# Patient Record
Sex: Male | Born: 1945
Health system: Southern US, Community
[De-identification: ages and names within clinical notes are randomized; demographics above are authoritative.]

## PROBLEM LIST (undated history)

## (undated) DIAGNOSIS — K589 Irritable bowel syndrome without diarrhea: Secondary | ICD-10-CM

## (undated) DIAGNOSIS — D649 Anemia, unspecified: Secondary | ICD-10-CM

## (undated) DIAGNOSIS — N529 Male erectile dysfunction, unspecified: Secondary | ICD-10-CM

## (undated) DIAGNOSIS — K8020 Calculus of gallbladder without cholecystitis without obstruction: Secondary | ICD-10-CM

## (undated) DIAGNOSIS — I00 Rheumatic fever without heart involvement: Secondary | ICD-10-CM

## (undated) DIAGNOSIS — E785 Hyperlipidemia, unspecified: Secondary | ICD-10-CM

## (undated) DIAGNOSIS — F419 Anxiety disorder, unspecified: Secondary | ICD-10-CM

## (undated) DIAGNOSIS — F32A Depression, unspecified: Secondary | ICD-10-CM

## (undated) DIAGNOSIS — I7300 Raynaud's syndrome without gangrene: Secondary | ICD-10-CM

## (undated) DIAGNOSIS — Z860100 Personal history of colon polyps, unspecified: Secondary | ICD-10-CM

## (undated) DIAGNOSIS — F329 Major depressive disorder, single episode, unspecified: Secondary | ICD-10-CM

## (undated) DIAGNOSIS — K5790 Diverticulosis of intestine, part unspecified, without perforation or abscess without bleeding: Secondary | ICD-10-CM

## (undated) DIAGNOSIS — K449 Diaphragmatic hernia without obstruction or gangrene: Secondary | ICD-10-CM

## (undated) DIAGNOSIS — N400 Enlarged prostate without lower urinary tract symptoms: Secondary | ICD-10-CM

## (undated) DIAGNOSIS — K222 Esophageal obstruction: Secondary | ICD-10-CM

## (undated) DIAGNOSIS — M797 Fibromyalgia: Secondary | ICD-10-CM

## (undated) DIAGNOSIS — K5090 Crohn's disease, unspecified, without complications: Secondary | ICD-10-CM

## (undated) DIAGNOSIS — Z8489 Family history of other specified conditions: Secondary | ICD-10-CM

## (undated) DIAGNOSIS — K219 Gastro-esophageal reflux disease without esophagitis: Secondary | ICD-10-CM

## (undated) DIAGNOSIS — R011 Cardiac murmur, unspecified: Secondary | ICD-10-CM

## (undated) HISTORY — DX: Calculus of gallbladder without cholecystitis without obstruction: K80.20

## (undated) HISTORY — PX: POLYPECTOMY: SHX149

## (undated) HISTORY — DX: Diverticulosis of intestine, part unspecified, without perforation or abscess without bleeding: K57.90

## (undated) HISTORY — DX: Raynaud's syndrome without gangrene: I73.00

## (undated) HISTORY — DX: Hyperlipidemia, unspecified: E78.5

## (undated) HISTORY — DX: Depression, unspecified: F32.A

## (undated) HISTORY — DX: Male erectile dysfunction, unspecified: N52.9

## (undated) HISTORY — DX: Esophageal obstruction: K22.2

## (undated) HISTORY — DX: Personal history of colon polyps, unspecified: Z86.0100

## (undated) HISTORY — DX: Diaphragmatic hernia without obstruction or gangrene: K44.9

## (undated) HISTORY — DX: Rheumatic fever without heart involvement: I00

## (undated) HISTORY — DX: Crohn's disease, unspecified, without complications: K50.90

## (undated) HISTORY — DX: Major depressive disorder, single episode, unspecified: F32.9

## (undated) HISTORY — DX: Irritable bowel syndrome, unspecified: K58.9

## (undated) HISTORY — DX: Anxiety disorder, unspecified: F41.9

## (undated) HISTORY — DX: Personal history of colonic polyps: Z86.010

## (undated) HISTORY — PX: TRANSURETHRAL RESECTION OF PROSTATE: SHX73

## (undated) HISTORY — DX: Benign prostatic hyperplasia without lower urinary tract symptoms: N40.0

## (undated) HISTORY — DX: Gastro-esophageal reflux disease without esophagitis: K21.9

## (undated) HISTORY — PX: COLONOSCOPY: SHX174

## (undated) HISTORY — PX: CHOLECYSTECTOMY: SHX55

---

## 1999-01-10 ENCOUNTER — Encounter (INDEPENDENT_AMBULATORY_CARE_PROVIDER_SITE_OTHER): Payer: Self-pay | Admitting: Specialist

## 1999-01-10 ENCOUNTER — Ambulatory Visit (HOSPITAL_COMMUNITY): Admission: RE | Admit: 1999-01-10 | Discharge: 1999-01-10 | Payer: Self-pay | Admitting: Gastroenterology

## 1999-05-03 ENCOUNTER — Encounter: Payer: Self-pay | Admitting: Gastroenterology

## 1999-05-03 ENCOUNTER — Ambulatory Visit (HOSPITAL_COMMUNITY): Admission: RE | Admit: 1999-05-03 | Discharge: 1999-05-03 | Payer: Self-pay | Admitting: Gastroenterology

## 2001-01-05 ENCOUNTER — Emergency Department (HOSPITAL_COMMUNITY): Admission: EM | Admit: 2001-01-05 | Discharge: 2001-01-05 | Payer: Self-pay

## 2001-05-27 ENCOUNTER — Encounter: Payer: Self-pay | Admitting: Urology

## 2001-05-27 ENCOUNTER — Encounter: Admission: RE | Admit: 2001-05-27 | Discharge: 2001-05-27 | Payer: Self-pay | Admitting: Urology

## 2002-01-18 ENCOUNTER — Encounter: Admission: RE | Admit: 2002-01-18 | Discharge: 2002-01-18 | Payer: Self-pay | Admitting: Specialist

## 2002-01-18 ENCOUNTER — Encounter: Payer: Self-pay | Admitting: Specialist

## 2003-03-01 ENCOUNTER — Encounter: Payer: Self-pay | Admitting: Emergency Medicine

## 2003-03-01 ENCOUNTER — Encounter: Payer: Self-pay | Admitting: General Surgery

## 2003-03-01 ENCOUNTER — Encounter (INDEPENDENT_AMBULATORY_CARE_PROVIDER_SITE_OTHER): Payer: Self-pay | Admitting: Specialist

## 2003-03-01 ENCOUNTER — Inpatient Hospital Stay (HOSPITAL_COMMUNITY): Admission: EM | Admit: 2003-03-01 | Discharge: 2003-03-03 | Payer: Self-pay | Admitting: Emergency Medicine

## 2004-04-29 ENCOUNTER — Ambulatory Visit: Payer: Self-pay | Admitting: Gastroenterology

## 2004-05-01 ENCOUNTER — Ambulatory Visit (HOSPITAL_COMMUNITY): Admission: RE | Admit: 2004-05-01 | Discharge: 2004-05-01 | Payer: Self-pay | Admitting: Gastroenterology

## 2004-05-01 ENCOUNTER — Ambulatory Visit: Payer: Self-pay | Admitting: Gastroenterology

## 2004-05-16 ENCOUNTER — Ambulatory Visit: Payer: Self-pay | Admitting: Gastroenterology

## 2004-09-16 ENCOUNTER — Encounter: Admission: RE | Admit: 2004-09-16 | Discharge: 2004-09-16 | Payer: Self-pay | Admitting: Rheumatology

## 2007-06-28 ENCOUNTER — Ambulatory Visit: Payer: Self-pay | Admitting: Internal Medicine

## 2007-07-22 ENCOUNTER — Ambulatory Visit: Payer: Self-pay | Admitting: Internal Medicine

## 2007-07-22 ENCOUNTER — Encounter: Payer: Self-pay | Admitting: Internal Medicine

## 2007-07-22 LAB — CONVERTED CEMR LAB
Basophils Relative: 0.9 % (ref 0.0–1.0)
CRP: 1.3 mg/dL — ABNORMAL HIGH (ref <0.6)
HCT: 45.3 % (ref 39.0–52.0)
Hemoglobin: 14.8 g/dL (ref 13.0–17.0)
Monocytes Absolute: 0.7 10*3/uL (ref 0.2–0.7)
Monocytes Relative: 9.5 % (ref 3.0–11.0)
RBC: 5.55 M/uL (ref 4.22–5.81)
RDW: 13.3 % (ref 11.5–14.6)

## 2007-09-08 ENCOUNTER — Ambulatory Visit: Payer: Self-pay | Admitting: Internal Medicine

## 2008-02-08 ENCOUNTER — Telehealth: Payer: Self-pay | Admitting: Internal Medicine

## 2009-01-24 ENCOUNTER — Ambulatory Visit: Payer: Self-pay | Admitting: Cardiology

## 2009-01-24 DIAGNOSIS — R072 Precordial pain: Secondary | ICD-10-CM | POA: Insufficient documentation

## 2009-01-24 DIAGNOSIS — E7800 Pure hypercholesterolemia, unspecified: Secondary | ICD-10-CM | POA: Insufficient documentation

## 2009-01-24 DIAGNOSIS — R5381 Other malaise: Secondary | ICD-10-CM

## 2009-01-24 DIAGNOSIS — R5383 Other fatigue: Secondary | ICD-10-CM | POA: Insufficient documentation

## 2009-01-25 ENCOUNTER — Encounter: Payer: Self-pay | Admitting: Cardiology

## 2009-02-09 ENCOUNTER — Ambulatory Visit: Payer: Self-pay | Admitting: Cardiology

## 2009-02-09 ENCOUNTER — Ambulatory Visit: Payer: Self-pay

## 2009-02-09 DIAGNOSIS — R9439 Abnormal result of other cardiovascular function study: Secondary | ICD-10-CM | POA: Insufficient documentation

## 2009-02-13 ENCOUNTER — Telehealth (INDEPENDENT_AMBULATORY_CARE_PROVIDER_SITE_OTHER): Payer: Self-pay | Admitting: *Deleted

## 2009-02-14 ENCOUNTER — Encounter (HOSPITAL_COMMUNITY): Admission: RE | Admit: 2009-02-14 | Discharge: 2009-04-13 | Payer: Self-pay | Admitting: Cardiology

## 2009-02-14 ENCOUNTER — Ambulatory Visit: Payer: Self-pay | Admitting: Cardiology

## 2009-02-14 ENCOUNTER — Ambulatory Visit: Payer: Self-pay

## 2009-02-16 ENCOUNTER — Encounter: Payer: Self-pay | Admitting: Cardiology

## 2009-02-19 ENCOUNTER — Ambulatory Visit: Payer: Self-pay | Admitting: Cardiology

## 2009-02-22 ENCOUNTER — Ambulatory Visit: Payer: Self-pay | Admitting: Cardiology

## 2009-02-22 ENCOUNTER — Inpatient Hospital Stay (HOSPITAL_BASED_OUTPATIENT_CLINIC_OR_DEPARTMENT_OTHER): Admission: RE | Admit: 2009-02-22 | Discharge: 2009-02-22 | Payer: Self-pay | Admitting: Cardiology

## 2009-02-22 LAB — CONVERTED CEMR LAB
Basophils Relative: 0 % (ref 0.0–3.0)
CO2: 29 meq/L (ref 19–32)
Chloride: 96 meq/L (ref 96–112)
Creatinine, Ser: 1 mg/dL (ref 0.4–1.5)
Eosinophils Absolute: 0.4 10*3/uL (ref 0.0–0.7)
Eosinophils Relative: 4.8 % (ref 0.0–5.0)
HCT: 45.7 % (ref 39.0–52.0)
Hemoglobin: 15.4 g/dL (ref 13.0–17.0)
Lymphs Abs: 2.5 10*3/uL (ref 0.7–4.0)
MCHC: 33.8 g/dL (ref 30.0–36.0)
MCV: 87.7 fL (ref 78.0–100.0)
Monocytes Absolute: 0.4 10*3/uL (ref 0.1–1.0)
Neutro Abs: 4 10*3/uL (ref 1.4–7.7)
Neutrophils Relative %: 55.3 % (ref 43.0–77.0)
Potassium: 4.7 meq/L (ref 3.5–5.1)
RBC: 5.2 M/uL (ref 4.22–5.81)

## 2009-12-19 ENCOUNTER — Telehealth: Payer: Self-pay | Admitting: Internal Medicine

## 2009-12-25 ENCOUNTER — Encounter (INDEPENDENT_AMBULATORY_CARE_PROVIDER_SITE_OTHER): Payer: Self-pay | Admitting: *Deleted

## 2010-06-01 ENCOUNTER — Encounter: Payer: Self-pay | Admitting: Internal Medicine

## 2010-06-11 NOTE — Progress Notes (Signed)
Summary: Med refill   Phone Note Outgoing Call   Call placed by: Milford Cage [place],  December 19, 2009 2:19 PM Call placed to: Patient Summary of Call: Called patient and left message for him to call to make a follow-up appt. with Dr. Marina Goodell before I given him refills of his Omeprazole.  It has been since 08/2007 since he saw Dr. Marina Goodell last.   Initial call taken by: Milford Cage NCMA,  December 19, 2009 2:20 PM  Follow-up for Phone Call        left message on machine to call back Chales Abrahams CMA Duncan Dull)  December 25, 2009 9:37 AM   letter mailed Follow-up by: Chales Abrahams CMA Duncan Dull),  December 25, 2009 4:28 PM

## 2010-06-11 NOTE — Letter (Signed)
Summary: Appointment Reminder   Gastroenterology  [address] [place], [place] [address]   Phone: [phone]  Fax: [phone]        December 25, 2009 MRN: [id number]    [facility] [address] [place], [place]  [address]    Dear Mr. MIZRAHI,   We have been unable to reach you by phone to schedule a follow up   appointment that was recommended for you by Dr. Marina Goodell. It is very   important that we reach you to schedule an appointment. We hope that you  allow [place] to participate in your health care needs. Please contact us at  [phone] at your earliest convenience to schedule your appointment.     Sincerely,    Chales Abrahams CMA (AAMA)  Appended Document: Appointment Reminder pt aware

## 2010-07-22 ENCOUNTER — Encounter: Payer: Self-pay | Admitting: Internal Medicine

## 2010-07-30 NOTE — Letter (Signed)
Summary: New Patient letter  [facility]astroenterology  [address] [place], [place] [address]   Phone: [phone]  Fax: [phone]       07/22/2010 MRN: [id number]  [facility] [address] [place], [place]  [address]  Dear Alexander Lambert,  Welcome to the Gastroenterology Division at [facility].    You are scheduled to see Dr.  Marina Goodell on 08-29-10 at 3:30pm on the 3rd floor at [facility], [address]  We ask that you try to arrive at our office 15 minutes prior to your appointment time to allow for check-in.  We would like you to complete the enclosed self-administered evaluation form prior to your visit and bring it with you on the day of your appointment.  We will review it with you.  Also, please bring a complete list of all your medications or, if you prefer, bring the medication bottles and we will list them.  Please bring your insurance card so that we may make a copy of it.  If your insurance requires a referral to see a specialist, please bring your referral form from your primary care physician.  Co-payments are due at the time of your visit and may be paid by cash, check or credit card.     Your office visit will consist of a consult with your physician (includes a physical exam), any laboratory testing he/she may order, scheduling of any necessary diagnostic testing (e.g. x-ray, ultrasound, CT-scan), and scheduling of a procedure (e.g. Endoscopy, Colonoscopy) if required.  Please allow enough time on your schedule to allow for any/all of these possibilities.    If you cannot keep your appointment, please call [phone] to cancel or reschedule prior to your appointment date.  This allows [place] the opportunity to schedule an appointment for another patient in need of care.  If you do not cancel or reschedule by 5 p.m. the business day prior to your appointment date, you will be charged a $50.00 late cancellation/no-show fee.    Thank you for choosing [facility] for your medical needs.  We appreciate the opportunity to care for you.  Please visit [place] at our website  to learn more about our practice.                     Sincerely,                                                             The Gastroenterology Division

## 2010-07-31 ENCOUNTER — Other Ambulatory Visit (HOSPITAL_COMMUNITY): Payer: Self-pay | Admitting: Orthopedic Surgery

## 2010-07-31 DIAGNOSIS — M549 Dorsalgia, unspecified: Secondary | ICD-10-CM

## 2010-08-07 ENCOUNTER — Encounter (HOSPITAL_COMMUNITY)
Admission: RE | Admit: 2010-08-07 | Discharge: 2010-08-07 | Disposition: A | Discharge status: Home / Self Care | Payer: Medicare Other | Source: Ambulatory Visit | Attending: Orthopedic Surgery | Admitting: Orthopedic Surgery

## 2010-08-07 ENCOUNTER — Ambulatory Visit (HOSPITAL_COMMUNITY)
Admission: RE | Admit: 2010-08-07 | Discharge: 2010-08-07 | Disposition: A | Discharge status: Home / Self Care | Payer: Medicare Other | Source: Ambulatory Visit | Attending: Orthopedic Surgery | Admitting: Orthopedic Surgery

## 2010-08-07 ENCOUNTER — Encounter (HOSPITAL_COMMUNITY): Payer: Self-pay

## 2010-08-07 DIAGNOSIS — M545 Low back pain, unspecified: Secondary | ICD-10-CM | POA: Insufficient documentation

## 2010-08-07 DIAGNOSIS — M4120 Other idiopathic scoliosis, site unspecified: Secondary | ICD-10-CM | POA: Insufficient documentation

## 2010-08-07 DIAGNOSIS — IMO0001 Reserved for inherently not codable concepts without codable children: Secondary | ICD-10-CM | POA: Insufficient documentation

## 2010-08-07 DIAGNOSIS — M549 Dorsalgia, unspecified: Secondary | ICD-10-CM

## 2010-08-07 DIAGNOSIS — M47814 Spondylosis without myelopathy or radiculopathy, thoracic region: Secondary | ICD-10-CM | POA: Insufficient documentation

## 2010-08-07 DIAGNOSIS — M25569 Pain in unspecified knee: Secondary | ICD-10-CM | POA: Insufficient documentation

## 2010-08-07 HISTORY — DX: Fibromyalgia: M79.7

## 2010-08-07 MED ORDER — TECHNETIUM TC 99M MEDRONATE IV KIT
23.4000 | PACK | Freq: Once | INTRAVENOUS | Status: AC | PRN
  Administered 2010-08-07: 23.4 via INTRAVENOUS

## 2010-08-29 ENCOUNTER — Encounter: Payer: Self-pay | Admitting: Internal Medicine

## 2010-08-29 ENCOUNTER — Ambulatory Visit (INDEPENDENT_AMBULATORY_CARE_PROVIDER_SITE_OTHER): Payer: Medicare Other | Admitting: Internal Medicine

## 2010-08-29 VITALS — BP 136/82 | HR 68 | Ht 73.0 in | Wt 203.0 lb

## 2010-08-29 DIAGNOSIS — D509 Iron deficiency anemia, unspecified: Secondary | ICD-10-CM

## 2010-08-29 DIAGNOSIS — R197 Diarrhea, unspecified: Secondary | ICD-10-CM

## 2010-08-29 DIAGNOSIS — K222 Esophageal obstruction: Secondary | ICD-10-CM

## 2010-08-29 DIAGNOSIS — K5010 Crohn's disease of large intestine without complications: Secondary | ICD-10-CM

## 2010-08-29 DIAGNOSIS — K219 Gastro-esophageal reflux disease without esophagitis: Secondary | ICD-10-CM

## 2010-08-29 DIAGNOSIS — R1310 Dysphagia, unspecified: Secondary | ICD-10-CM

## 2010-08-29 MED ORDER — PEG-KCL-NACL-NASULF-NA ASC-C 100 G PO SOLR: 1.0000 | Freq: Once | ORAL | Status: AC

## 2010-08-29 NOTE — Patient Instructions (Signed)
Colon/Endo with Dil. 09/05/10 2:00 pm arrive at 1:00 pm on 4th floor Moviprep prescription sent to your pharmacy for you to pick up Colonoscopy and Endoscopy brochures given for you to read.
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
## 2010-08-30 ENCOUNTER — Encounter: Payer: Self-pay | Admitting: Internal Medicine

## 2010-09-04 ENCOUNTER — Encounter: Payer: Self-pay | Admitting: Internal Medicine

## 2010-09-05 ENCOUNTER — Encounter: Payer: Self-pay | Admitting: Internal Medicine

## 2010-09-05 ENCOUNTER — Ambulatory Visit (AMBULATORY_SURGERY_CENTER): Payer: Medicare Other | Admitting: Internal Medicine

## 2010-09-05 VITALS — BP 142/44 | HR 83 | Temp 98.5°F | Resp 16 | Ht 72.0 in | Wt 200.0 lb

## 2010-09-05 DIAGNOSIS — K5730 Diverticulosis of large intestine without perforation or abscess without bleeding: Secondary | ICD-10-CM

## 2010-09-05 DIAGNOSIS — R1310 Dysphagia, unspecified: Secondary | ICD-10-CM

## 2010-09-05 DIAGNOSIS — R197 Diarrhea, unspecified: Secondary | ICD-10-CM

## 2010-09-05 DIAGNOSIS — K5090 Crohn's disease, unspecified, without complications: Secondary | ICD-10-CM

## 2010-09-05 DIAGNOSIS — K222 Esophageal obstruction: Secondary | ICD-10-CM

## 2010-09-05 DIAGNOSIS — Z86010 Personal history of colonic polyps: Secondary | ICD-10-CM

## 2010-09-05 DIAGNOSIS — K449 Diaphragmatic hernia without obstruction or gangrene: Secondary | ICD-10-CM

## 2010-09-05 DIAGNOSIS — D509 Iron deficiency anemia, unspecified: Secondary | ICD-10-CM

## 2010-09-05 MED ORDER — SODIUM CHLORIDE 0.9 % IV SOLN: 500.0000 mL | INTRAVENOUS | Status: DC

## 2010-09-05 NOTE — Progress Notes (Signed)
Pressure applied to abdomen to reach cecum.  

## 2010-09-05 NOTE — Patient Instructions (Signed)
Discharged instructions given with verbal understanding. Handouts on diverticulosis, hiatal hernia, and a dilatation diet. Resume previous medications.

## 2010-09-06 ENCOUNTER — Telehealth: Payer: Self-pay

## 2010-09-06 NOTE — Telephone Encounter (Signed)
No answer

## 2010-09-24 NOTE — Assessment & Plan Note (Signed)
[facility]                         GASTROENTEROLOGY OFFICE NOTE   NAME:Alexander Lambert, Alexander Lambert                          MRN:          [id number]  DATE:06/28/2007                            DOB:          12-01-1945    REASON FOR EVALUATION:  1. Recurrent dysphagia.  2. Surveillance colonoscopy.   HISTORY:  This is a 65 year old white male with a history of  hyperlipidemia, anxiety/depression, benign esophageal stricture  requiring esophageal dilation, and unspecified mild ileocolitis,  possibly Crohn's.  Patient was a longstanding patient of Dr. Victorino Dike.  He has not been seen in this office since December of 2005.  He has undergone colonoscopy on two occasions.  His initial colonoscopy  was performed in August of 2000 to evaluate loose stools.  At that time  he was noted to have mild inflammatory process of the right colon and  terminal ileum.  Biopsies revealed chronic active ileitis and chronic  active colitis consistent with Crohn's.  He subsequently underwent  colonoscopy in November of 2004.  Again he was noted to have scattered  inflammatory changes.  Biopsies of the small bowel at that time were  negative.  However, biopsies of the colon again revealed chronic active  colitis consistent with Crohn's.  Patient did have inflammatory bowel  disease markers, which were negative.  He had been on Asacol  periodically, but has been on no such medication for years.  Currently  he denies any lower GI complaints such as altered bowel habits, loose  stools or bleeding.  His last upper endoscopy was performed January of  2006,  Symptomatic distal stricture of the esophagus was dilated with a  52 [place] Maloney dilator.  This resulted in marked improvement of his  dysphagia until recent months.  He has had recurrent problems with solid  foods principally.  He rarely has reflux symptoms for which he takes  Tums about once per week.  He states he may have had  colon polyps.  I do  not see any evidence of such on his prior reports.   PAST MEDICAL HISTORY:  1. Hyperlipidemia.  2. Anxiety and depression.  3. Fibromyalgia.  4. Esophageal stricture.  5. Inflammatory bowel disease.  6. Skin cancer.  7. Status post cholecystectomy.   ALLERGIES:  No known drug allergies.   CURRENT MEDICATIONS:  1. Zetia 10 mg daily.  2. Zoloft 200 mg daily.  3. Aspirin 81 mg daily.  4. Vicodin p.r.n.   FAMILY HISTORY:  No family history of gastrointestinal malignancy.  Father with heart disease.   SOCIAL HISTORY:  Patient is married with three children and lives with  his wife.  He has a college degree.  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  Does not smoke.  Does occasionally use  tobacco and alcohol.   REVIEW OF SYSTEMS:  Per diagnostic evaluation form.   PHYSICAL EXAMINATION:  Well appearing male in no acute distress.  Blood  pressure is 106/70, heart rate is 70 and regular.  Weight is 206.4  pounds.  He is 6 feet  2 inches in height.  HEENT:  Sclerae anicteric.  Conjunctivae are pink.  Oral mucosa is  intact.  No adenopathy.  LUNGS:  Clear.  HEART:  Regular.  ABDOMEN:  Soft without tenderness, mass or hernia.  Good bowel sounds  heard.  EXTREMITIES:  Without edema.   IMPRESSION:  1. Probable Crohn's colitis.  Currently asymptomatic off medical      therapy.  2. Recurrent dysphagia due to known esophageal stricture.  3. Occasional reflux symptoms, possibly the cause for stricture.   RECOMMENDATIONS:  1. Colonoscopy to assess for activity and extent of probable      inflammatory bowel disease, as well provide neoplasia screening.      The nature of the procedure as well as the risks, benefits and      alternatives were reviewed.  He understood and agreed to proceed.  2. Upper endoscopy with esophageal dilation.  The nature of the      procedure as well as the risks, benefits and alternatives were      reviewed.  He  understood and agreed to proceed.  3. Ongoing general medical care with Dr. Waynard Edwards.     Wilhemina Bonito. Marina Goodell, MD  Electronically Signed    JNP/MedQ  DD: 06/28/2007  DT: 06/28/2007  Job #: [id number]   cc:   Loraine Leriche A. Perini, M.D.

## 2010-09-24 NOTE — Assessment & Plan Note (Signed)
[facility]                         GASTROENTEROLOGY OFFICE NOTE   NAME:Galindez, COULSON WEHNER                          MRN:          [id number]  DATE:09/08/2007                            DOB:          10/08/1945    HISTORY:  Mr. Stankovich presents today for followup regarding management of  dysphagia and Crohn's colitis.  Patient was evaluated on June 28, 2007 for recurrent dysphagia and surveillance colonoscopy.  See that  dictation for details.  He had been a previous patient of Dr. Doreatha Martin  [place].  He underwent both colonoscopy and upper endoscopy on July 22, 2007.  His colonoscopy revealed scattered aphthous ulcers throughout  the colon, consistent with Crohn's disease.  Biopsies were also  consistent with the same.  The ileum was normal.  He did have left-sided  diverticulosis.  Upper endoscopy revealed the distal esophageal  stricture measuring 14 mm.  Esophagitis was present.  The stricture was  dilated with a sequential balloon up to 18 mm.  He was placed on Nexium.  Since his procedures, the patient reports resolution of dysphagia.  No  heartburn or indigestion.  He continues on a proton pump inhibitor,  though he switched to Prilosec OTC 20 mg daily.  In terms of his lower  abdomen, no complaints of abdominal pain, bleeding, or diarrhea.  I have  reviewed his procedures and biopsy results in detail.   Current medications include Zetia, Zoloft, aspirin, Lyrica, and  Prilosec.   PHYSICAL EXAMINATION:  A well-appearing male in no acute distress.  Blood pressure 102/68, heart rate 72.  Weight is 204.4 pounds.  HEENT:  Sclerae are anicteric.  LUNGS:  Clear.  HEART:  Regular.  ABDOMEN:  Soft without tenderness, mass, or hernia.  Good bowel sounds  heard.   IMPRESSION:  1. Crohn's colitis:  No real symptoms.  Recent laboratories reveal      minimally elevated C-reactive protein at 1.3. The  hemoglobin was      normal at 14.8.  We discussed today  the pro's and con's of medical      therapy.  We have decided on initiating aminosalicylates in hopes      of reducing colonic inflammation / ulceration.  2. Gastroesophageal reflux disease complicated by peptic stricture:      Currently asymptomatic post dilation on Prilosec.   RECOMMENDATIONS:  1. Continued Prilosec 20 mg daily.  2. Followup as needed for recurrent dysphagia.  3. Initiate Lialda 2.4 mg daily.  Samples have been provided as well      as prescription with      refills.  4. Office followup in one year.  Sooner if clinically indicated.     Wilhemina Bonito. Marina Goodell, MD  Electronically Signed    JNP/MedQ  DD: 09/08/2007  DT: 09/08/2007  Job #: [id number]   cc:   Loraine Leriche A. Perini, M.D.

## 2010-09-27 NOTE — Op Note (Signed)
NAME:  Alexander Lambert, Alexander Lambert NO.:  [id number]   MEDICAL RECORD NO.:  [id number]                   PATIENT TYPE:  INP   LOCATION:  1824                                 FACILITY:  [facility]   PHYSICIAN:  Jimmye Norman III, M.D.               DATE OF BIRTH:  1946-05-11   DATE OF PROCEDURE:  03/01/2003  DATE OF DISCHARGE:                                 OPERATIVE REPORT   PREOPERATIVE DIAGNOSIS:  Cholelithiasis and biliary colic with obstructed  cystic duct on HIDA scan.   POSTOPERATIVE DIAGNOSIS:  Acute cholecystitis with cholelithiasis.   PROCEDURE:  Laparoscopic cholecystectomy.   SURGEON:  Jimmye Norman, M.D.   ASSESSMENT:  No assistant.   ANESTHESIA:  General endotracheal.   ESTIMATED BLOOD LOSS:  75 to 100 mL.   COMPLICATIONS:  None.   CONDITION:  Stable.   INDICATION FOR OPERATION:  The patient is a 65 year old male with right  upper quadrant pain and nausea, with an ultrasound demonstrating a  gallstone, a CT scan demonstrating a gallstone and a HIDA scan demonstrating  an obstructed cystic duct.   FINDINGS:  The patient had evidence of acute cholecystitis with omental  adhesions to the dome of the gallbladder.  There were also adhesions to the  infundibulum of the gallbladder.  The cystic duct was necrotic, as were the  arterial structures, which were ligated with Hemoclips.   OPERATION:  The patient was taken to the operating room and placed on the  table in a supine position.  After an adequate endotracheal anesthetic was  administered, he was prepped and draped in the usual sterile manner,  exposing the midline and the right upper quadrant.   A supraumbilical curvilinear incision was made using a #11 blade and taken  down to the midline fascia using blunt dissection.  It was through this  midline fascia that a Veress needle was passed into the peritoneal cavity  and confirmed to be in position with the saline test.  Once this was done,  carbon dioxide insufflation was instilled into the peritoneal cavity up to a  maximum intra-abdominal pressure of 15 mmHg.   We subsequently passed an 11/12-mm bladed cannula and catheter and trocar  into the peritoneal cavity, confirming their position with the laparoscope  and attached camera light source.  We then passed two 5-mm cannulae in the  right costal margin area and a subxiphoid 11/12-mm cannula into the  peritoneal cavity under direct vision after making the initial skin incision  using a #11 blade.  Once all cannulae were in place, the patient was placed  in a steep reversed Trendelenburg, the left side was tilted down and the  dissection begun.   The gallbladder was noted, after taking off the omental adhesions with blunt  dissection, to be very tense.  We used a Nezhat aspirating needle to  aspirate out the clear bowel  from the distended gallbladder.  We then placed  a ratcheted grasper onto the dome of the gallbladder, retracted it towards  the anterior abdominal Harari and the right upper quadrant.   This exposed the structures down towards the infundibulum which were adhesed  now closely to the infundibulum.  With proper positioning, we were able to  identify the infundibulum and dissect away the peritoneum overlying that and  identify a necrotic cystic duct which was hanging on by a small thread.  We  were able to place proximal clips along the common duct side of the cystic  duct and then subsequently transect it.  There were smaller vessels which  appeared to go to the gallbladder in the triangle of Calot, which were  ligated with Endoclips and subsequently transected.  We then dissected the  gallbladder out of its bed using electrocautery on an endohook.  There was  no entrance into the gallbladder.  There was some bleeding down near the  hilum, however, this was controlled with Surgicel and at the end of the  case, was no longer bleeding.   We brought out the  gallbladder out of the supraumbilical site using an  EndoCatch bag after it had been completely detached from the gallbladder  bed.  There was significant edema in the bed, however, the dissection went  well with minimal bleeding.   Once we brought it out through the supraumbilical site, we did place a piece  of Surgicel down towards the hilum where there was no bleeding.  We did,  because of necrosis of the cystic duct, place a 19-mm Blake drain down into  Morrison's pouch and brought it out through the lateral-most trocar site.  We secured it in place with 3-0 nylon.  We irrigated with 3 L of warm saline  solution and once this was done and there was no further bleeding noted, we  removed all cannula and closed all sites.   The supraumbilical site was closed using a figure-of-eight stitch of 0  Vicryl passed on a UR-6 needle.  We injected the skin in all sites using  0.25% Marcaine with epinephrine.  All skin was closed using a running  subcuticular stitch of 4-0 Vicryl, except for the site of the drain.  Once  this was done, sterile dressings were applied including Steri-Strips,  Betadine ointment and OpSite.                                               Kathrin Ruddy, M.D.    JW/MEDQ  D:  03/01/2003  T:  03/02/2003  Job:  [id number]   cc:   Molpus, Rumeal L. M.D.   Doug Sou, M.D.  [address]  [place] [address]  Fax: [phone]
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
## 2011-04-01 ENCOUNTER — Encounter (HOSPITAL_COMMUNITY): Payer: Medicare Other

## 2011-04-09 ENCOUNTER — Other Ambulatory Visit (HOSPITAL_COMMUNITY): Payer: Self-pay | Admitting: *Deleted

## 2011-04-10 ENCOUNTER — Other Ambulatory Visit (HOSPITAL_COMMUNITY): Payer: Self-pay | Admitting: *Deleted

## 2011-04-11 ENCOUNTER — Encounter (HOSPITAL_COMMUNITY): Payer: Self-pay

## 2011-04-11 ENCOUNTER — Encounter (HOSPITAL_COMMUNITY)
Admission: RE | Admit: 2011-04-11 | Discharge: 2011-04-11 | Disposition: A | Discharge status: Home / Self Care | Payer: Medicare Other | Source: Ambulatory Visit | Attending: Internal Medicine | Admitting: Internal Medicine

## 2011-04-11 ENCOUNTER — Other Ambulatory Visit (HOSPITAL_COMMUNITY): Payer: Self-pay | Admitting: *Deleted

## 2011-04-11 DIAGNOSIS — D649 Anemia, unspecified: Secondary | ICD-10-CM | POA: Insufficient documentation

## 2011-04-11 MED ORDER — FERUMOXYTOL INJECTION 510 MG/17 ML
510.0000 mg | Freq: Once | INTRAVENOUS | Status: AC
  Administered 2011-04-11: 510 mg via INTRAVENOUS
  Filled 2011-04-11: qty 17

## 2011-04-11 MED ORDER — SODIUM CHLORIDE 0.9 % IV SOLN
INTRAVENOUS | Status: DC
  Administered 2011-04-11: 250 mL via INTRAVENOUS

## 2011-04-14 ENCOUNTER — Ambulatory Visit (HOSPITAL_COMMUNITY)
Admission: RE | Admit: 2011-04-14 | Discharge: 2011-04-14 | Disposition: A | Discharge status: Home / Self Care | Payer: Medicare Other | Source: Ambulatory Visit | Attending: Internal Medicine | Admitting: Internal Medicine

## 2011-04-14 ENCOUNTER — Encounter (HOSPITAL_COMMUNITY): Payer: Self-pay

## 2011-04-14 DIAGNOSIS — D649 Anemia, unspecified: Secondary | ICD-10-CM | POA: Insufficient documentation

## 2011-04-14 MED ORDER — FERUMOXYTOL INJECTION 510 MG/17 ML
510.0000 mg | Freq: Once | INTRAVENOUS | Status: AC
  Administered 2011-04-14: 510 mg via INTRAVENOUS
  Filled 2011-04-14: qty 17

## 2011-04-14 MED ORDER — SODIUM CHLORIDE 0.9 % IV SOLN
INTRAVENOUS | Status: DC
  Administered 2011-04-14: 250 mL via INTRAVENOUS

## 2011-09-16 ENCOUNTER — Ambulatory Visit
Admission: RE | Admit: 2011-09-16 | Discharge: 2011-09-16 | Disposition: A | Discharge status: Home / Self Care | Payer: Medicare Other | Source: Ambulatory Visit | Attending: Internal Medicine | Admitting: Internal Medicine

## 2011-09-16 ENCOUNTER — Other Ambulatory Visit: Payer: Self-pay | Admitting: Internal Medicine

## 2011-09-16 DIAGNOSIS — J984 Other disorders of lung: Secondary | ICD-10-CM

## 2012-09-07 ENCOUNTER — Encounter: Payer: Self-pay | Admitting: Internal Medicine

## 2012-09-07 ENCOUNTER — Ambulatory Visit (INDEPENDENT_AMBULATORY_CARE_PROVIDER_SITE_OTHER): Payer: Medicare Other | Admitting: Internal Medicine

## 2012-09-07 VITALS — BP 106/72 | HR 89 | Ht 72.0 in | Wt 215.0 lb

## 2012-09-07 DIAGNOSIS — K222 Esophageal obstruction: Secondary | ICD-10-CM

## 2012-09-07 DIAGNOSIS — K219 Gastro-esophageal reflux disease without esophagitis: Secondary | ICD-10-CM

## 2012-09-07 DIAGNOSIS — K5010 Crohn's disease of large intestine without complications: Secondary | ICD-10-CM

## 2012-09-07 DIAGNOSIS — R1310 Dysphagia, unspecified: Secondary | ICD-10-CM

## 2012-09-07 DIAGNOSIS — D509 Iron deficiency anemia, unspecified: Secondary | ICD-10-CM
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
## 2012-09-07 NOTE — Patient Instructions (Addendum)
You have been scheduled for an endoscopy with dilation with propofol. Please follow written instructions given to you at your visit today. If you use inhalers (even only as needed), please bring them with you on the day of your procedure.  Your physician has requested that you go to www.[email or web address].com and enter the access code given to you at your visit today. This web site gives a general overview about your procedure. However, you should still follow specific instructions given to you by our office regarding your preparation for the procedure.  

## 2012-10-05 ENCOUNTER — Ambulatory Visit (AMBULATORY_SURGERY_CENTER): Payer: Medicare Other | Admitting: Internal Medicine

## 2012-10-05 ENCOUNTER — Encounter: Payer: Self-pay | Admitting: Internal Medicine

## 2012-10-05 VITALS — BP 139/84 | HR 63 | Temp 98.1°F | Resp 31 | Ht 72.0 in | Wt 215.0 lb

## 2012-10-05 DIAGNOSIS — K222 Esophageal obstruction: Secondary | ICD-10-CM

## 2012-10-05 DIAGNOSIS — R1310 Dysphagia, unspecified: Secondary | ICD-10-CM

## 2012-10-05 DIAGNOSIS — K219 Gastro-esophageal reflux disease without esophagitis: Secondary | ICD-10-CM

## 2012-10-05 MED ORDER — SODIUM CHLORIDE 0.9 % IV SOLN: 500.0000 mL | INTRAVENOUS | Status: DC

## 2012-10-05 NOTE — Op Note (Signed)
[facility] [address] [place], [address]   ENDOSCOPY PROCEDURE REPORT  PATIENT: Alexander, Lambert  MR#: [id number] BIRTHDATE: Nov 26, 1945 , 67  yrs. old GENDER: Male ENDOSCOPIST: Roxy Cedar, MD REFERRED BY:  Rodrigo Ran, M.D. PROCEDURE DATE:  10/05/2012 PROCEDURE:  Balloon dilation of esophagus  - 18mm ASA CLASS:     Class II INDICATIONS:  Therapeutic procedure.   Dysphagia. MEDICATIONS: MAC sedation, administered by CRNA and propofol (Diprivan) 300mg  IV TOPICAL ANESTHETIC: none  DESCRIPTION OF PROCEDURE: After the risks benefits and alternatives of the procedure were thoroughly explained, informed consent was obtained.  The LB [id number] endoscope was introduced through the mouth and advanced to the second portion of the duodenum. Without limitations.  The instrument was slowly withdrawn as the mucosa was fully examined.      EXAM: 15 mm ring-like stricture at GEJ (35 cm from teeth).  6 cm hiatal hernia.  Otherwise normal esophagus, stomach and duodenum. Retroflexed views revealed a hiatal hernia.  THERAPY: 18 MM TTS BALLOON USED TO DILATE STRICTURE. MODERATE RESISTANCE AND HEME PRESENT (LOCAL RING DISRUPTION). TOLERATED WELLThe scope was then withdrawn from the patient and the procedure completed.  COMPLICATIONS: There were no complications. ENDOSCOPIC IMPRESSION: 1. 15 mm ring-like stricture at GEJ S/P DILATION 2. GERD  RECOMMENDATIONS: 1.  Clear liquids until 2 pm, then soft foods rest of day.  Resume prior diet tomorrow. 2.  Continue PPI therapy  REPEAT EXAM:  eSigned:  Roxy Cedar, MD 10/05/2012 12:07 PM   FA:OZHY Perini, MD and The Patient

## 2012-10-05 NOTE — Patient Instructions (Signed)
YOU HAD AN ENDOSCOPIC PROCEDURE TODAY AT THE [facility]: Refer to the procedure report that was given to you for any specific questions about what was found during the examination.  If the procedure report does not answer your questions, please call your gastroenterologist to clarify.  If you requested that your care partner not be given the details of your procedure findings, then the procedure report has been included in a sealed envelope for you to review at your convenience later.  YOU SHOULD EXPECT: Some feelings of bloating in the abdomen. Passage of more gas than usual.  Walking can help get rid of the air that was put into your GI tract during the procedure and reduce the bloating. If you had a lower endoscopy (such as a colonoscopy or flexible sigmoidoscopy) you may notice spotting of blood in your stool or on the toilet paper. If you underwent a bowel prep for your procedure, then you may not have a normal bowel movement for a few days.  DIET: Your first meal following the procedure should be a light meal and then it is ok to progress to your normal diet.  A half-sandwich or bowl of soup is an example of a good first meal.  Heavy or fried foods are harder to digest and may make you feel nauseous or bloated.  Likewise meals heavy in dairy and vegetables can cause extra gas to form and this can also increase the bloating.  Drink plenty of fluids but you should avoid alcoholic beverages for 24 hours.  ACTIVITY: Your care partner should take you home directly after the procedure.  You should plan to take it easy, moving slowly for the rest of the day.  You can resume normal activity the day after the procedure however you should NOT DRIVE or use heavy machinery for 24 hours (because of the sedation medicines used during the test).    SYMPTOMS TO REPORT IMMEDIATELY: A gastroenterologist can be reached at any hour.  During normal business hours, 8:30 AM to 5:00 PM Monday through Friday,  call [phone].  After hours and on weekends, please call the GI answering service at [phone] who will take a message and have the physician on call contact you.    Following upper endoscopy (EGD)  Vomiting of blood or coffee ground material  New chest pain or pain under the shoulder blades  Painful or persistently difficult swallowing  New shortness of breath  Fever of 100F or higher  Black, tarry-looking stools  FOLLOW UP: If any biopsies were taken you will be contacted by phone or by letter within the next 1-3 weeks.  Call your gastroenterologist if you have not heard about the biopsies in 3 weeks.  Our staff will call the home number listed on your records the next business day following your procedure to check on you and address any questions or concerns that you may have at that time regarding the information given to you following your procedure. This is a courtesy call and so if there is no answer at the home number and we have not heard from you through the emergency physician on call, we will assume that you have returned to your regular daily activities without incident.  SIGNATURES/CONFIDENTIALITY: You and/or your care partner have signed paperwork which will be entered into your electronic medical record.  These signatures attest to the fact that that the information above on your After Visit Summary has been reviewed and is understood.  Full  responsibility of the confidentiality of this discharge information lies with you and/or your care-partner.  Clear liquids until 2:00pm, then soft foods for the rest of day. Resume prior diet tomorrow. Continue PPI therapy.

## 2012-10-05 NOTE — Progress Notes (Signed)
NO EGG OR SOY ALLERGY, EWM

## 2012-10-05 NOTE — Progress Notes (Signed)
Patient did not experience any of the following events: a burn prior to discharge; a fall within the facility; wrong site/side/patient/procedure/implant event; or a hospital transfer or hospital admission upon discharge from the facility. (G8907) Patient did not have preoperative order for IV antibiotic SSI prophylaxis. (G8918)  

## 2012-10-05 NOTE — Progress Notes (Signed)
Called to room to assist during endoscopic procedure.  Patient ID and intended procedure confirmed with present staff. Received instructions for my participation in the procedure from the performing physician.  

## 2012-10-06 ENCOUNTER — Telehealth: Payer: Self-pay

## 2012-10-06 NOTE — Telephone Encounter (Signed)
  Follow up Call-  Call back number 10/05/2012 09/05/2010  Post procedure Call Back phone  # [phone]  Permission to leave phone message Yes -     Patient questions:  Do you have a fever, pain , or abdominal swelling? no Pain Score  0 *  Have you tolerated food without any problems? yes  Have you been able to return to your normal activities? yes  Do you have any questions about your discharge instructions: Diet   no Medications  no Follow up visit  no  Do you have questions or concerns about your Care? no  Actions: * If pain score is 4 or above: No action needed, pain <4.

## 2012-10-25 ENCOUNTER — Encounter: Payer: Self-pay | Admitting: Internal Medicine

## 2012-10-25 ENCOUNTER — Ambulatory Visit (INDEPENDENT_AMBULATORY_CARE_PROVIDER_SITE_OTHER): Payer: Medicare Other | Admitting: Internal Medicine

## 2012-10-25 VITALS — BP 144/76 | HR 68 | Ht 72.0 in | Wt 205.0 lb

## 2012-10-25 DIAGNOSIS — K222 Esophageal obstruction: Secondary | ICD-10-CM

## 2012-10-25 DIAGNOSIS — K5010 Crohn's disease of large intestine without complications: Secondary | ICD-10-CM

## 2012-10-25 DIAGNOSIS — R1310 Dysphagia, unspecified: Secondary | ICD-10-CM

## 2012-10-25 DIAGNOSIS — K219 Gastro-esophageal reflux disease without esophagitis: Secondary | ICD-10-CM

## 2012-10-25 NOTE — Patient Instructions (Signed)
Please follow up in one year or sooner if symptoms reoccur.
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
## 2012-10-28 ENCOUNTER — Telehealth: Payer: Self-pay | Admitting: Internal Medicine

## 2012-10-28 NOTE — Telephone Encounter (Signed)
Pt scheduled for previsit and egd with dil with Dr. Marina Goodell. See OV Note.

## 2012-11-01 ENCOUNTER — Ambulatory Visit (AMBULATORY_SURGERY_CENTER): Payer: Medicare Other | Admitting: *Deleted

## 2012-11-01 ENCOUNTER — Encounter: Payer: Self-pay | Admitting: Internal Medicine

## 2012-11-01 VITALS — Ht 72.0 in | Wt 207.0 lb

## 2012-11-01 DIAGNOSIS — K222 Esophageal obstruction: Secondary | ICD-10-CM

## 2012-11-01 DIAGNOSIS — R1310 Dysphagia, unspecified: Secondary | ICD-10-CM

## 2012-11-09 ENCOUNTER — Ambulatory Visit (AMBULATORY_SURGERY_CENTER): Payer: Medicare Other | Admitting: Internal Medicine

## 2012-11-09 ENCOUNTER — Encounter: Payer: Self-pay | Admitting: Internal Medicine

## 2012-11-09 VITALS — BP 112/68 | HR 68 | Temp 97.4°F | Resp 13 | Ht 72.0 in | Wt 207.0 lb

## 2012-11-09 DIAGNOSIS — K222 Esophageal obstruction: Secondary | ICD-10-CM

## 2012-11-09 DIAGNOSIS — R1310 Dysphagia, unspecified: Secondary | ICD-10-CM

## 2012-11-09 DIAGNOSIS — K219 Gastro-esophageal reflux disease without esophagitis: Secondary | ICD-10-CM

## 2012-11-09 MED ORDER — SODIUM CHLORIDE 0.9 % IV SOLN: 500.0000 mL | INTRAVENOUS | Status: DC

## 2012-11-09 NOTE — Progress Notes (Signed)
1430 COUGHING AND  O2 DESATURATION NOTED SCOPED REMOVED AND SUCTIONED. O2 INCREASED TO 10L.AND REPOSITIONED JAW . SATURATION RETURNED TO NORMAL .SUCTIONED. PROCEDURE COMPLETED WITHOUT DIFF. TO PACU

## 2012-11-09 NOTE — Progress Notes (Signed)
Patient to restroom prior to discharge.

## 2012-11-09 NOTE — Progress Notes (Signed)
Patient did not experience any of the following events: a burn prior to discharge; a fall within the facility; wrong site/side/patient/procedure/implant event; or a hospital transfer or hospital admission upon discharge from the facility. (G8907) Patient did not have preoperative order for IV antibiotic SSI prophylaxis. (G8918)  

## 2012-11-09 NOTE — Progress Notes (Signed)
Patient denies any discomfort. No subq air palpated from chin to chest.

## 2012-11-09 NOTE — Patient Instructions (Signed)
YOU HAD AN ENDOSCOPIC PROCEDURE TODAY AT THE [facility]: Refer to the procedure report that was given to you for any specific questions about what was found during the examination.  If the procedure report does not answer your questions, please call your gastroenterologist to clarify.  If you requested that your care partner not be given the details of your procedure findings, then the procedure report has been included in a sealed envelope for you to review at your convenience later.  YOU SHOULD EXPECT: Some feelings of bloating in the abdomen. Passage of more gas than usual.  Walking can help get rid of the air that was put into your GI tract during the procedure and reduce the bloating. If you had a lower endoscopy (such as a colonoscopy or flexible sigmoidoscopy) you may notice spotting of blood in your stool or on the toilet paper. If you underwent a bowel prep for your procedure, then you may not have a normal bowel movement for a few days.  DIET:  Nothing to eat or drink until 3:45. Only clear liquids 3:45 through 5:00 today. After 5:00 pm soft foods for the remainder of the day. Resume your diet in AM.    ACTIVITY: Your care partner should take you home directly after the procedure.  You should plan to take it easy, moving slowly for the rest of the day.  You can resume normal activity the day after the procedure however you should NOT DRIVE or use heavy machinery for 24 hours (because of the sedation medicines used during the test).    SYMPTOMS TO REPORT IMMEDIATELY: A gastroenterologist can be reached at any hour.  During normal business hours, 8:30 AM to 5:00 PM Monday through Friday, call [phone].  After hours and on weekends, please call the GI answering service at [phone] who will take a message and have the physician on call contact you.  Following upper endoscopy (EGD)  Vomiting of blood or coffee ground material  New chest pain or pain under the  shoulder blades  Painful or persistently difficult swallowing  New shortness of breath  Fever of 100F or higher  Black, tarry-looking stools  FOLLOW UP: If any biopsies were taken you will be contacted by phone or by letter within the next 1-3 weeks.  Call your gastroenterologist if you have not heard about the biopsies in 3 weeks.  Our staff will call the home number listed on your records the next business day following your procedure to check on you and address any questions or concerns that you may have at that time regarding the information given to you following your procedure. This is a courtesy call and so if there is no answer at the home number and we have not heard from you through the emergency physician on call, we will assume that you have returned to your regular daily activities without incident.  SIGNATURES/CONFIDENTIALITY: You and/or your care partner have signed paperwork which will be entered into your electronic medical record.  These signatures attest to the fact that that the information above on your After Visit Summary has been reviewed and is understood.  Full responsibility of the confidentiality of this discharge information lies with you and/or your care-partner.

## 2012-11-09 NOTE — Op Note (Signed)
[facility] [address] [place], [address]   ENDOSCOPY PROCEDURE REPORT  PATIENT: Jamian, Andujo  MR#: [id number] BIRTHDATE: 04-30-1946 , 67  yrs. old GENDER: Male ENDOSCOPIST: Roxy Cedar, MD REFERRED BY:  .  Self / Office PROCEDURE DATE:  11/09/2012 PROCEDURE:  Balloon dilation of esophagus  - 18,19,20 mm ASA CLASS:     Class II INDICATIONS:  Dysphagia.   Therapeutic procedure.  Last dilated 10-05-12 to 18mm, w/o relief MEDICATIONS: MAC sedation, administered by CRNA and propofol (Diprivan) 300mg  IV TOPICAL ANESTHETIC: none  DESCRIPTION OF PROCEDURE: After the risks benefits and alternatives of the procedure were thoroughly explained, informed consent was obtained.  The LB [id number] endoscope was introduced through the mouth and advanced to the second portion of the duodenum. Without limitations.  The instrument was slowly withdrawn as the mucosa was fully examined.    EXAM:Tortuous esophagus.  15mm stricture at 33cm.  8cm hiatal hernia with multiple Cameron erosions.  Normal stomach otherwise.  Normal duodenum.  THERAPY: TTS Balloon used to dilate distal stricture.  18,19,72mm sequentially.  ring disruption with heme on the largest dilation. tolerated well.  Retroflexed views revealed a hiatal hernia. The scope was then withdrawn from the patient and the procedure completed.  COMPLICATIONS: There were no complications. ENDOSCOPIC IMPRESSION: 1. Esophageal stricture s/p dilation to 20mm 2. Large hiatal hernia with Sheria Lang erosions  RECOMMENDATIONS: 1.  Clear liquids until 5pm, then soft foods rest of day.  Resume prior diet tomorrow. 2.  Continue PPI  REPEAT EXAM:  eSigned:  Roxy Cedar, MD 11/09/2012 2:46 PM   UX:LKGM Perini, MD and The Patient

## 2012-11-09 NOTE — Progress Notes (Signed)
Called to room to assist during endoscopic procedure.  Patient ID and intended procedure confirmed with present staff. Received instructions for my participation in the procedure from the performing physician.  

## 2012-11-10 ENCOUNTER — Telehealth: Payer: Self-pay

## 2012-11-10 NOTE — Telephone Encounter (Signed)
  Follow up Call-  Call back number 11/09/2012 10/05/2012 09/05/2010  Post procedure Call Back phone  # [phone]  Permission to leave phone message Yes Yes -     Patient questions:  Do you have a fever, pain , or abdominal swelling? no Pain Score  0 *  Have you tolerated food without any problems? yes  Have you been able to return to your normal activities? yes  Do you have any questions about your discharge instructions: Diet   no Medications  no Follow up visit  no  Do you have questions or concerns about your Care? no  Actions: * If pain score is 4 or above: No action needed, pain <4.   Per the pt everything went perfectly.  No problems or concerns. Maw

## 2012-11-16 ENCOUNTER — Encounter: Payer: Self-pay | Admitting: Internal Medicine

## 2013-04-15 ENCOUNTER — Telehealth: Payer: Self-pay | Admitting: Internal Medicine

## 2013-04-15 NOTE — Telephone Encounter (Signed)
Pt states that he was told by his PCP that he is anemic. States he was told to call for an appt. Pt is getting iron replacement treatment. Pt scheduled to see Dr. Marina Goodell 05/13/13@3 :15pm. Pt aware of appt.

## 2013-04-21 ENCOUNTER — Other Ambulatory Visit (HOSPITAL_COMMUNITY): Payer: Self-pay | Admitting: *Deleted

## 2013-04-22 ENCOUNTER — Ambulatory Visit (HOSPITAL_COMMUNITY)
Admission: RE | Admit: 2013-04-22 | Discharge: 2013-04-22 | Disposition: A | Discharge status: Home / Self Care | Payer: Medicare Other | Source: Ambulatory Visit | Attending: Internal Medicine | Admitting: Internal Medicine

## 2013-04-22 DIAGNOSIS — D649 Anemia, unspecified: Secondary | ICD-10-CM | POA: Insufficient documentation

## 2013-04-22 MED ORDER — SODIUM CHLORIDE 0.9 % IV SOLN
1020.0000 mg | Freq: Once | INTRAVENOUS | Status: AC
  Administered 2013-04-22: 1020 mg via INTRAVENOUS
  Filled 2013-04-22: qty 34

## 2013-05-13 ENCOUNTER — Ambulatory Visit: Payer: Medicare Other | Admitting: Internal Medicine

## 2013-05-27 ENCOUNTER — Ambulatory Visit (INDEPENDENT_AMBULATORY_CARE_PROVIDER_SITE_OTHER): Payer: Medicare Other | Admitting: Internal Medicine

## 2013-05-27 ENCOUNTER — Encounter: Payer: Self-pay | Admitting: Internal Medicine

## 2013-05-27 ENCOUNTER — Other Ambulatory Visit (INDEPENDENT_AMBULATORY_CARE_PROVIDER_SITE_OTHER): Payer: Medicare HMO

## 2013-05-27 VITALS — BP 100/52 | HR 96 | Ht 70.0 in | Wt 209.1 lb

## 2013-05-27 DIAGNOSIS — D509 Iron deficiency anemia, unspecified: Secondary | ICD-10-CM

## 2013-05-27 DIAGNOSIS — K5010 Crohn's disease of large intestine without complications: Secondary | ICD-10-CM

## 2013-05-27 DIAGNOSIS — K222 Esophageal obstruction: Secondary | ICD-10-CM

## 2013-05-27 DIAGNOSIS — K219 Gastro-esophageal reflux disease without esophagitis: Secondary | ICD-10-CM

## 2013-05-27 LAB — FERRITIN: Ferritin: 62.1 ng/mL (ref 22.0–322.0)

## 2013-05-27 LAB — IBC PANEL
Iron: 30 ug/dL — ABNORMAL LOW (ref 42–165)
Saturation Ratios: 8.4 % — ABNORMAL LOW (ref 20.0–50.0)
Transferrin: 256.2 mg/dL (ref 212.0–360.0)

## 2013-05-27 LAB — CBC WITH DIFFERENTIAL/PLATELET
BASOS PCT: 0.9 % (ref 0.0–3.0)
Basophils Absolute: 0.1 10*3/uL (ref 0.0–0.1)
EOS ABS: 0.3 10*3/uL (ref 0.0–0.7)
Eosinophils Relative: 4.1 % (ref 0.0–5.0)
HEMATOCRIT: 42.1 % (ref 39.0–52.0)
HEMOGLOBIN: 13.8 g/dL (ref 13.0–17.0)
LYMPHS ABS: 1.9 10*3/uL (ref 0.7–4.0)
LYMPHS PCT: 27.1 % (ref 12.0–46.0)
MCHC: 32.7 g/dL (ref 30.0–36.0)
MCV: 83.3 fl (ref 78.0–100.0)
Monocytes Absolute: 0.7 10*3/uL (ref 0.1–1.0)
Monocytes Relative: 9.5 % (ref 3.0–12.0)
NEUTROS ABS: 4.1 10*3/uL (ref 1.4–7.7)
Neutrophils Relative %: 58.4 % (ref 43.0–77.0)
Platelets: 278 10*3/uL (ref 150.0–400.0)
RBC: 5.05 Mil/uL (ref 4.22–5.81)
RDW: 17.5 % — ABNORMAL HIGH (ref 11.5–14.6)
WBC: 7.1 10*3/uL (ref 4.5–10.5)

## 2013-05-27 LAB — IRON: Iron: 30 ug/dL — ABNORMAL LOW (ref 42–165)

## 2013-05-27 MED ORDER — FERROUS SULFATE 325 (65 FE) MG PO TABS: 325.0000 mg | ORAL_TABLET | Freq: Two times a day (BID) | ORAL | Status: DC

## 2013-05-27 NOTE — Patient Instructions (Addendum)
You have been given a separate informational sheet regarding your tobacco use, the importance of quitting and local resources to help you quit.  Your physician has requested that you go to the basement for the following lab work before leaving today:  Iron studies, CBC  We have sent the following medications to your pharmacy for you to pick up at your convenience:  Iron sulfate
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
## 2013-06-07 ENCOUNTER — Telehealth: Payer: Self-pay | Admitting: Internal Medicine

## 2013-06-07 NOTE — Telephone Encounter (Signed)
Pt called and wanted to know what his Hgb was on his last labs. Discussed lab values with pt and questions were answered.

## 2014-06-29 ENCOUNTER — Other Ambulatory Visit: Payer: Self-pay | Admitting: Internal Medicine

## 2014-06-29 DIAGNOSIS — R51 Headache: Secondary | ICD-10-CM

## 2014-06-29 DIAGNOSIS — R519 Headache, unspecified: Secondary | ICD-10-CM

## 2014-06-29 DIAGNOSIS — R6884 Jaw pain: Secondary | ICD-10-CM

## 2014-07-14 ENCOUNTER — Ambulatory Visit
Admission: RE | Admit: 2014-07-14 | Discharge: 2014-07-14 | Disposition: A | Discharge status: Home / Self Care | Payer: Medicare HMO | Source: Ambulatory Visit | Attending: Internal Medicine | Admitting: Internal Medicine

## 2014-07-14 DIAGNOSIS — R51 Headache: Secondary | ICD-10-CM

## 2014-07-14 DIAGNOSIS — R6884 Jaw pain: Secondary | ICD-10-CM

## 2014-07-14 DIAGNOSIS — R519 Headache, unspecified: Secondary | ICD-10-CM

## 2014-07-14 MED ORDER — GADOBENATE DIMEGLUMINE 529 MG/ML IV SOLN
20.0000 mL | Freq: Once | INTRAVENOUS | Status: AC | PRN
  Administered 2014-07-14: 20 mL via INTRAVENOUS

## 2014-12-15 ENCOUNTER — Encounter (HOSPITAL_COMMUNITY): Payer: Self-pay | Admitting: Emergency Medicine

## 2014-12-15 ENCOUNTER — Emergency Department (HOSPITAL_COMMUNITY)
Admission: EM | Admit: 2014-12-15 | Discharge: 2014-12-16 | Disposition: A | Discharge status: Home / Self Care | Payer: Medicare HMO | Attending: Emergency Medicine | Admitting: Emergency Medicine

## 2014-12-15 DIAGNOSIS — I10 Essential (primary) hypertension: Secondary | ICD-10-CM | POA: Diagnosis not present

## 2014-12-15 DIAGNOSIS — G500 Trigeminal neuralgia: Secondary | ICD-10-CM

## 2014-12-15 DIAGNOSIS — E559 Vitamin D deficiency, unspecified: Secondary | ICD-10-CM | POA: Insufficient documentation

## 2014-12-15 DIAGNOSIS — F329 Major depressive disorder, single episode, unspecified: Secondary | ICD-10-CM | POA: Diagnosis not present

## 2014-12-15 DIAGNOSIS — Z7982 Long term (current) use of aspirin: Secondary | ICD-10-CM | POA: Insufficient documentation

## 2014-12-15 DIAGNOSIS — K219 Gastro-esophageal reflux disease without esophagitis: Secondary | ICD-10-CM | POA: Insufficient documentation

## 2014-12-15 DIAGNOSIS — Z87438 Personal history of other diseases of male genital organs: Secondary | ICD-10-CM | POA: Diagnosis not present

## 2014-12-15 DIAGNOSIS — Z79899 Other long term (current) drug therapy: Secondary | ICD-10-CM | POA: Diagnosis not present

## 2014-12-15 DIAGNOSIS — M1990 Unspecified osteoarthritis, unspecified site: Secondary | ICD-10-CM | POA: Diagnosis not present

## 2014-12-15 DIAGNOSIS — R6884 Jaw pain: Secondary | ICD-10-CM | POA: Diagnosis present

## 2014-12-15 DIAGNOSIS — E785 Hyperlipidemia, unspecified: Secondary | ICD-10-CM | POA: Diagnosis not present

## 2014-12-15 DIAGNOSIS — R51 Headache: Secondary | ICD-10-CM

## 2014-12-15 DIAGNOSIS — F419 Anxiety disorder, unspecified: Secondary | ICD-10-CM | POA: Insufficient documentation

## 2014-12-15 DIAGNOSIS — Z720 Tobacco use: Secondary | ICD-10-CM | POA: Diagnosis not present

## 2014-12-15 DIAGNOSIS — Z86010 Personal history of colonic polyps: Secondary | ICD-10-CM | POA: Insufficient documentation

## 2014-12-15 DIAGNOSIS — R519 Headache, unspecified: Secondary | ICD-10-CM

## 2014-12-15 LAB — CBC
HCT: 42.5 % (ref 39.0–52.0)
HEMOGLOBIN: 14.3 g/dL (ref 13.0–17.0)
MCH: 29.5 pg (ref 26.0–34.0)
MCHC: 33.6 g/dL (ref 30.0–36.0)
MCV: 87.8 fL (ref 78.0–100.0)
Platelets: 254 10*3/uL (ref 150–400)
RBC: 4.84 MIL/uL (ref 4.22–5.81)
RDW: 13.1 % (ref 11.5–15.5)
WBC: 7.4 10*3/uL (ref 4.0–10.5)

## 2014-12-15 LAB — I-STAT TROPONIN, ED: Troponin i, poc: 0 ng/mL (ref 0.00–0.08)

## 2014-12-15 LAB — BASIC METABOLIC PANEL
ANION GAP: 10 (ref 5–15)
BUN: 10 mg/dL (ref 6–20)
CALCIUM: 9.6 mg/dL (ref 8.9–10.3)
CHLORIDE: 102 mmol/L (ref 101–111)
CO2: 27 mmol/L (ref 22–32)
CREATININE: 0.97 mg/dL (ref 0.61–1.24)
GLUCOSE: 94 mg/dL (ref 65–99)
POTASSIUM: 4.5 mmol/L (ref 3.5–5.1)
Sodium: 139 mmol/L (ref 135–145)

## 2014-12-15 MED ORDER — HYDROMORPHONE HCL 1 MG/ML IJ SOLN
1.0000 mg | Freq: Once | INTRAMUSCULAR | Status: DC
Start: 2014-12-15 — End: 2014-12-15

## 2014-12-15 MED ORDER — HYDROCODONE-ACETAMINOPHEN 10-325 MG PO TABS
1.0000 | ORAL_TABLET | Freq: Once | ORAL | Status: AC
  Administered 2014-12-15: 1 via ORAL
  Filled 2014-12-15: qty 1

## 2014-12-15 NOTE — ED Notes (Signed)
Pt upset that EDP has not been in to see pt. Pt threatening to leave. EDP notified

## 2014-12-15 NOTE — ED Notes (Signed)
C/o intermittent L jaw pain since January.  Pt states he has seen PCP and dentist for same and states pain is not dental related.  Denies sob, nausea, and vomiting.
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
## 2014-12-15 NOTE — ED Notes (Signed)
EDP at bedside  

## 2014-12-15 NOTE — ED Notes (Signed)
EDP notified of BP 210's/110's

## 2014-12-16 MED ORDER — CARBAMAZEPINE 200 MG PO TABS: ORAL_TABLET | ORAL | Status: DC

## 2014-12-16 MED ORDER — CARBAMAZEPINE 100 MG PO CHEW: 100.0000 mg | CHEWABLE_TABLET | Freq: Two times a day (BID) | ORAL | Status: DC

## 2014-12-16 MED ORDER — CARBAMAZEPINE ER 200 MG PO TB12: 200.0000 mg | ORAL_TABLET | Freq: Two times a day (BID) | ORAL | Status: DC

## 2014-12-16 NOTE — Discharge Instructions (Signed)
Follow-up with your specialist and primary doctors directed. Once pain is controlled if your blood pressure still elevated he will need further evaluation and possibly start on medications. If you develop cardiac symptoms such as chest pain shortness of breath, sweating or other new concerns please return to the ER.  If you were given medicines take as directed.  If you are on coumadin or contraceptives realize their levels and effectiveness is altered by many different medicines.  If you have any reaction (rash, tongues swelling, other) to the medicines stop taking and see a physician.    If your blood pressure was elevated in the ER make sure you follow up for management with a primary doctor or return for chest pain, shortness of breath or stroke symptoms.  Please follow up as directed and return to the ER or see a physician for new or worsening symptoms.  Thank you. Filed Vitals:   12/15/14 2245 12/15/14 2300 12/15/14 2345 12/16/14 0000  BP: 155/87 186/94 178/99 178/98  Pulse: 64 79 78 69  Temp:      TempSrc:      Resp:  20    Height:      Weight:      SpO2: 100% 100% 100% 100%

## 2014-12-18 ENCOUNTER — Other Ambulatory Visit: Payer: Self-pay | Admitting: Internal Medicine

## 2015-02-05 ENCOUNTER — Encounter: Payer: Self-pay | Admitting: Internal Medicine

## 2015-02-05 ENCOUNTER — Ambulatory Visit (INDEPENDENT_AMBULATORY_CARE_PROVIDER_SITE_OTHER): Payer: Commercial Managed Care - HMO | Admitting: Internal Medicine

## 2015-02-05 VITALS — BP 142/90 | HR 72 | Ht 69.69 in | Wt 217.4 lb

## 2015-02-05 DIAGNOSIS — K2100 Gastro-esophageal reflux disease with esophagitis, without bleeding: Secondary | ICD-10-CM

## 2015-02-05 DIAGNOSIS — K5010 Crohn's disease of large intestine without complications: Secondary | ICD-10-CM

## 2015-02-05 DIAGNOSIS — K222 Esophageal obstruction: Secondary | ICD-10-CM

## 2015-02-05 DIAGNOSIS — D509 Iron deficiency anemia, unspecified: Secondary | ICD-10-CM

## 2015-02-05 DIAGNOSIS — R1310 Dysphagia, unspecified: Secondary | ICD-10-CM

## 2015-02-05 MED ORDER — OMEPRAZOLE 40 MG PO CPDR: 40.0000 mg | DELAYED_RELEASE_CAPSULE | Freq: Every day | ORAL | Status: DC
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
## 2015-02-05 NOTE — Patient Instructions (Addendum)
You have been given a separate informational sheet regarding your tobacco use, the importance of quitting and local resources to help you quit.   We have sent the following medications to your pharmacy for you to pick up at your convenience:  Omeprazole  You have been scheduled for an endoscopy. Please follow written instructions given to you at your visit today. If you use inhalers (even only as needed), please bring them with you on the day of your procedure.  

## 2015-03-27 ENCOUNTER — Encounter: Payer: Commercial Managed Care - HMO | Admitting: Internal Medicine

## 2015-05-11 ENCOUNTER — Encounter (HOSPITAL_COMMUNITY): Payer: Self-pay | Admitting: Emergency Medicine

## 2015-05-11 ENCOUNTER — Emergency Department (HOSPITAL_COMMUNITY): Payer: Commercial Managed Care - HMO

## 2015-05-11 ENCOUNTER — Emergency Department (HOSPITAL_COMMUNITY)
Admission: EM | Admit: 2015-05-11 | Discharge: 2015-05-11 | Disposition: A | Discharge status: Home / Self Care | Payer: Commercial Managed Care - HMO | Attending: Emergency Medicine | Admitting: Emergency Medicine

## 2015-05-11 DIAGNOSIS — F17200 Nicotine dependence, unspecified, uncomplicated: Secondary | ICD-10-CM | POA: Diagnosis not present

## 2015-05-11 DIAGNOSIS — Y9389 Activity, other specified: Secondary | ICD-10-CM | POA: Diagnosis not present

## 2015-05-11 DIAGNOSIS — S61258A Open bite of other finger without damage to nail, initial encounter: Secondary | ICD-10-CM

## 2015-05-11 DIAGNOSIS — Y92830 Public park as the place of occurrence of the external cause: Secondary | ICD-10-CM | POA: Insufficient documentation

## 2015-05-11 DIAGNOSIS — W540XXA Bitten by dog, initial encounter: Secondary | ICD-10-CM | POA: Insufficient documentation

## 2015-05-11 DIAGNOSIS — Z8679 Personal history of other diseases of the circulatory system: Secondary | ICD-10-CM | POA: Diagnosis not present

## 2015-05-11 DIAGNOSIS — Y998 Other external cause status: Secondary | ICD-10-CM | POA: Diagnosis not present

## 2015-05-11 DIAGNOSIS — S61215A Laceration without foreign body of left ring finger without damage to nail, initial encounter: Secondary | ICD-10-CM | POA: Insufficient documentation

## 2015-05-11 DIAGNOSIS — S41152A Open bite of left upper arm, initial encounter: Secondary | ICD-10-CM

## 2015-05-11 DIAGNOSIS — S51852A Open bite of left forearm, initial encounter: Secondary | ICD-10-CM | POA: Diagnosis not present

## 2015-05-11 DIAGNOSIS — F329 Major depressive disorder, single episode, unspecified: Secondary | ICD-10-CM | POA: Diagnosis not present

## 2015-05-11 DIAGNOSIS — Z86010 Personal history of colonic polyps: Secondary | ICD-10-CM | POA: Insufficient documentation

## 2015-05-11 DIAGNOSIS — Z79899 Other long term (current) drug therapy: Secondary | ICD-10-CM | POA: Insufficient documentation

## 2015-05-11 DIAGNOSIS — S51832A Puncture wound without foreign body of left forearm, initial encounter: Secondary | ICD-10-CM | POA: Diagnosis not present

## 2015-05-11 DIAGNOSIS — Z7982 Long term (current) use of aspirin: Secondary | ICD-10-CM | POA: Diagnosis not present

## 2015-05-11 DIAGNOSIS — K219 Gastro-esophageal reflux disease without esophagitis: Secondary | ICD-10-CM | POA: Diagnosis not present

## 2015-05-11 DIAGNOSIS — Z87438 Personal history of other diseases of male genital organs: Secondary | ICD-10-CM | POA: Diagnosis not present

## 2015-05-11 DIAGNOSIS — Z23 Encounter for immunization: Secondary | ICD-10-CM | POA: Insufficient documentation

## 2015-05-11 DIAGNOSIS — S61253A Open bite of left middle finger without damage to nail, initial encounter: Secondary | ICD-10-CM | POA: Insufficient documentation

## 2015-05-11 DIAGNOSIS — F419 Anxiety disorder, unspecified: Secondary | ICD-10-CM | POA: Diagnosis not present

## 2015-05-11 DIAGNOSIS — M069 Rheumatoid arthritis, unspecified: Secondary | ICD-10-CM | POA: Insufficient documentation

## 2015-05-11 DIAGNOSIS — S61235A Puncture wound without foreign body of left ring finger without damage to nail, initial encounter: Secondary | ICD-10-CM | POA: Insufficient documentation

## 2015-05-11 DIAGNOSIS — Z8669 Personal history of other diseases of the nervous system and sense organs: Secondary | ICD-10-CM | POA: Insufficient documentation

## 2015-05-11 DIAGNOSIS — E785 Hyperlipidemia, unspecified: Secondary | ICD-10-CM | POA: Insufficient documentation

## 2015-05-11 MED ORDER — LIDOCAINE-EPINEPHRINE-TETRACAINE (LET) SOLUTION
3.0000 mL | Freq: Once | NASAL | Status: AC
  Administered 2015-05-11: 3 mL via TOPICAL
  Filled 2015-05-11: qty 3

## 2015-05-11 MED ORDER — RABIES IMMUNE GLOBULIN 150 UNIT/ML IM INJ
20.0000 [IU]/kg | INJECTION | Freq: Once | INTRAMUSCULAR | Status: AC
  Administered 2015-05-11: 1875 [IU] via INTRAMUSCULAR
  Filled 2015-05-11: qty 12.5

## 2015-05-11 MED ORDER — RABIES VACCINE, PCEC IM SUSR
1.0000 mL | Freq: Once | INTRAMUSCULAR | Status: AC
  Administered 2015-05-11: 1 mL via INTRAMUSCULAR
  Filled 2015-05-11: qty 1

## 2015-05-11 MED ORDER — TETANUS-DIPHTH-ACELL PERTUSSIS 5-2.5-18.5 LF-MCG/0.5 IM SUSP
0.5000 mL | Freq: Once | INTRAMUSCULAR | Status: AC
  Administered 2015-05-11: 0.5 mL via INTRAMUSCULAR
  Filled 2015-05-11: qty 0.5

## 2015-05-11 MED ORDER — TETANUS-DIPHTH-ACELL PERTUSSIS 5-2.5-18.5 LF-MCG/0.5 IM SUSP: 0.5000 mL | Freq: Once | INTRAMUSCULAR | Status: DC

## 2015-05-11 MED ORDER — LORAZEPAM 0.5 MG PO TABS
1.0000 mg | ORAL_TABLET | Freq: Once | ORAL | Status: AC
  Administered 2015-05-11: 1 mg via ORAL
  Filled 2015-05-11: qty 2
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
## 2015-05-11 NOTE — Discharge Instructions (Signed)
Please monitor wound for signs of infection.  Apply neosporin to wound daily and keep it clean.  Follow schedule below for rabies series at the Urgent [facility].  Return to ER if you have any concerns.   Animal Bite Animal bite wounds can get infected. It is important to get proper medical treatment. Ask your doctor if you need rabies treatment. HOME CARE  Wound Care  Follow instructions from your doctor about how to take care of your wound. Make sure you:  Wash your hands with soap and water before you change your bandage (dressing). If you cannot use soap and water, use hand sanitizer.  Change your bandage as told by your doctor.  Leave stitches (sutures), skin glue, or skin tape (adhesive) strips in place. They may need to stay in place for 2 weeks or longer. If tape strips get loose and curl up, you may trim the loose edges. Do not remove tape strips completely unless your doctor says it is okay.  Check your wound every day for signs of infection. Watch for:    Redness, swelling, or pain that gets worse.    Fluid, blood, or pus.  General Instructions  Take or apply over-the-counter and prescription medicines only as told by your doctor.   If you were prescribed an antibiotic, take or apply it as told by your doctor. Do not stop using the antibiotic even if your condition improves.   Keep the injured area raised (elevated) above the level of your heart while you are sitting or lying down.  If directed, apply ice to the injured area.    Put ice in a plastic bag.    Place a towel between your skin and the bag.    Leave the ice on for 20 minutes, 2-3 times per day.   Keep all follow-up visits as told by your doctor. This is important.  GET HELP IF:  You have redness, swelling, or pain that gets worse.   You have a general feeling of sickness (malaise).   You feel sick to your stomach (nauseous).  You throw up (vomit).   You have pain that does not  get better.  GET HELP RIGHT AWAY IF:   You have a red streak going away from your wound.   You have fluid, blood, or pus coming from your wound.   You have a fever or chills.   You have trouble moving your injured area.   You have numbness or tingling anywhere on your body.    This information is not intended to replace advice given to you by your health care provider. Make sure you discuss any questions you have with your health care provider.   Document Released: 04/28/2005 Document Revised: 01/17/2015 Document Reviewed: 09/13/2014 Elsevier Interactive Patient Education [address] [facility]     [id number], [place] [address]              [phone]                                  INSTRUCTIONS FOR THE PATIENT  Patient's  Name: Alexander Lambert                     Original Order Date:  05/11/2015  Medical Record Number: [id number]  ED Physician: . Primary Diagnosis: Rabies Exposure       PCP: Jerlyn Ly, MD . 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 Species of Animal: dogs (unknown) IMMUNOGLOBULIN INJECTION GIVEN IN THE ER?: yes   You have been seen in the Emergency Department for a possible rabies exposure.  You must return for the additional vaccine doses to the [facility] next to [facility].  If needed, your immunoglobulin follow-up injections, need to be scheduled for the dates below. If your first visit should fall on a weekend day, please come anytime between the hours of 9am-7pm.  DAY 0:  Date 12/30    At [facility] ER  DAY 3:  Date 05/14/2015     To:  Urgent Care  DAY 7:  Date 05/18/2015     To:  Urgent Care  DAY 14:  Date 05/25/2015   To:  Urgent Care   The [facility] is open from 8am-8pm Monday  thru Friday and 9am-7pm on Saturdays and Sundays.  There will be a minimal fee for the injection that will be billed to your insurance company along with the charge for the vaccine.                                                                                                Date: 05/11/2015  Patient Signature: _________________________________________________  [facility] Copy   Patient Copy      Pharmacy Copy

## 2015-05-11 NOTE — ED Notes (Signed)
Pt from dog park for eval of dog bites to left forearm and left fingers, states does not know which dog bit him and unsure if they are up to date on shots. Pulses present, pt states he is a little anxious right now. nad noted.

## 2015-05-11 NOTE — ED Notes (Signed)
Please see provider note for assessment.

## 2015-05-14 ENCOUNTER — Encounter (HOSPITAL_COMMUNITY): Payer: Self-pay

## 2015-05-14 ENCOUNTER — Emergency Department (HOSPITAL_COMMUNITY)
Admission: EM | Admit: 2015-05-14 | Discharge: 2015-05-14 | Disposition: A | Discharge status: Home / Self Care | Payer: PPO | Source: Home / Self Care

## 2015-05-14 DIAGNOSIS — Z203 Contact with and (suspected) exposure to rabies: Secondary | ICD-10-CM | POA: Diagnosis not present

## 2015-05-14 MED ORDER — RABIES VACCINE, PCEC IM SUSR
INTRAMUSCULAR | Status: AC
  Filled 2015-05-14: qty 1

## 2015-05-14 MED ORDER — RABIES VACCINE, PCEC IM SUSR
1.0000 mL | Freq: Once | INTRAMUSCULAR | Status: AC
  Administered 2015-05-14: 1 mL via INTRAMUSCULAR

## 2015-05-14 NOTE — ED Notes (Signed)
Here for day #3, shot #2 in rabies series. Minimal/moderate redness noted forearm, hand w swelling, wants to see his PCP tomorrow, and will ask about antibiotics at that time, since his wife got a rx for her bites. Advised if redness worsens , he is to go to the ED for evaluation

## 2015-05-15 DIAGNOSIS — S61452A Open bite of left hand, initial encounter: Secondary | ICD-10-CM | POA: Diagnosis not present

## 2015-05-15 DIAGNOSIS — Z6830 Body mass index (BMI) 30.0-30.9, adult: Secondary | ICD-10-CM | POA: Diagnosis not present

## 2015-05-15 DIAGNOSIS — L0889 Other specified local infections of the skin and subcutaneous tissue: Secondary | ICD-10-CM | POA: Diagnosis not present

## 2015-05-15 NOTE — ED Notes (Signed)
Spoke w patient. States he saw his PCP and was placed on an antibiotic . Reminded to return as scheduled for his upcoming shot in rabies series

## 2015-05-17 ENCOUNTER — Encounter (HOSPITAL_COMMUNITY): Payer: Self-pay

## 2015-05-17 ENCOUNTER — Emergency Department (INDEPENDENT_AMBULATORY_CARE_PROVIDER_SITE_OTHER)
Admission: EM | Admit: 2015-05-17 | Discharge: 2015-05-17 | Disposition: A | Discharge status: Home / Self Care | Payer: PPO | Source: Home / Self Care

## 2015-05-17 DIAGNOSIS — Z203 Contact with and (suspected) exposure to rabies: Secondary | ICD-10-CM | POA: Diagnosis not present

## 2015-05-17 MED ORDER — RABIES VACCINE, PCEC IM SUSR
INTRAMUSCULAR | Status: AC
  Filled 2015-05-17: qty 1

## 2015-05-17 MED ORDER — RABIES VACCINE, PCEC IM SUSR
1.0000 mL | Freq: Once | INTRAMUSCULAR | Status: AC
  Administered 2015-05-17: 1 mL via INTRAMUSCULAR

## 2015-05-17 NOTE — ED Notes (Signed)
Here for day #7, shot #3 of rabies series. NAD

## 2015-05-25 ENCOUNTER — Emergency Department (HOSPITAL_COMMUNITY)
Admission: EM | Admit: 2015-05-25 | Discharge: 2015-05-25 | Disposition: A | Discharge status: Home / Self Care | Payer: PPO | Source: Home / Self Care

## 2015-05-25 ENCOUNTER — Encounter (HOSPITAL_COMMUNITY): Payer: Self-pay | Admitting: Emergency Medicine

## 2015-05-25 MED ORDER — RABIES VACCINE, PCEC IM SUSR
1.0000 mL | Freq: Once | INTRAMUSCULAR | Status: AC
  Administered 2015-05-25: 1 mL via INTRAMUSCULAR

## 2015-05-25 MED ORDER — RABIES VACCINE, PCEC IM SUSR
INTRAMUSCULAR | Status: AC
  Filled 2015-05-25: qty 1

## 2015-05-25 NOTE — ED Notes (Signed)
Here for day 14 rabies injection 

## 2015-06-19 DIAGNOSIS — D6489 Other specified anemias: Secondary | ICD-10-CM | POA: Diagnosis not present

## 2015-06-19 DIAGNOSIS — Z125 Encounter for screening for malignant neoplasm of prostate: Secondary | ICD-10-CM | POA: Diagnosis not present

## 2015-06-19 DIAGNOSIS — E784 Other hyperlipidemia: Secondary | ICD-10-CM | POA: Diagnosis not present

## 2015-06-19 DIAGNOSIS — R7301 Impaired fasting glucose: Secondary | ICD-10-CM | POA: Diagnosis not present

## 2015-06-19 DIAGNOSIS — E538 Deficiency of other specified B group vitamins: Secondary | ICD-10-CM | POA: Diagnosis not present

## 2015-06-19 DIAGNOSIS — M109 Gout, unspecified: Secondary | ICD-10-CM | POA: Diagnosis not present

## 2015-06-19 DIAGNOSIS — M810 Age-related osteoporosis without current pathological fracture: Secondary | ICD-10-CM | POA: Diagnosis not present

## 2015-06-26 DIAGNOSIS — I638 Other cerebral infarction: Secondary | ICD-10-CM | POA: Diagnosis not present

## 2015-06-26 DIAGNOSIS — M109 Gout, unspecified: Secondary | ICD-10-CM | POA: Diagnosis not present

## 2015-06-26 DIAGNOSIS — G379 Demyelinating disease of central nervous system, unspecified: Secondary | ICD-10-CM | POA: Diagnosis not present

## 2015-06-26 DIAGNOSIS — I7090 Unspecified atherosclerosis: Secondary | ICD-10-CM | POA: Diagnosis not present

## 2015-06-26 DIAGNOSIS — D6489 Other specified anemias: Secondary | ICD-10-CM | POA: Diagnosis not present

## 2015-06-26 DIAGNOSIS — N401 Enlarged prostate with lower urinary tract symptoms: Secondary | ICD-10-CM | POA: Diagnosis not present

## 2015-06-26 DIAGNOSIS — Z1389 Encounter for screening for other disorder: Secondary | ICD-10-CM | POA: Diagnosis not present

## 2015-06-26 DIAGNOSIS — E538 Deficiency of other specified B group vitamins: Secondary | ICD-10-CM | POA: Diagnosis not present

## 2015-06-26 DIAGNOSIS — Z0000 Encounter for general adult medical examination without abnormal findings: Secondary | ICD-10-CM | POA: Diagnosis not present

## 2015-06-26 DIAGNOSIS — E668 Other obesity: Secondary | ICD-10-CM | POA: Diagnosis not present

## 2015-06-26 DIAGNOSIS — G500 Trigeminal neuralgia: Secondary | ICD-10-CM | POA: Diagnosis not present

## 2015-06-26 DIAGNOSIS — Z6830 Body mass index (BMI) 30.0-30.9, adult: Secondary | ICD-10-CM | POA: Diagnosis not present

## 2015-06-29 DIAGNOSIS — Z1212 Encounter for screening for malignant neoplasm of rectum: Secondary | ICD-10-CM | POA: Diagnosis not present

## 2015-07-16 ENCOUNTER — Encounter: Payer: Self-pay | Admitting: Internal Medicine

## 2015-08-28 ENCOUNTER — Ambulatory Visit (AMBULATORY_SURGERY_CENTER): Payer: Self-pay

## 2015-08-28 VITALS — Ht 71.0 in | Wt 218.2 lb

## 2015-08-28 DIAGNOSIS — Z860100 Personal history of colon polyps, unspecified: Secondary | ICD-10-CM

## 2015-08-28 MED ORDER — SUPREP BOWEL PREP KIT 17.5-3.13-1.6 GM/177ML PO SOLN: 1.0000 | Freq: Once | ORAL | Status: DC

## 2015-08-28 NOTE — Progress Notes (Signed)
No allergies to eggs or soy No past problems with anesthesia No diet meds No home oxygen  Has email and internet; registered with emmi

## 2015-09-03 DIAGNOSIS — E784 Other hyperlipidemia: Secondary | ICD-10-CM | POA: Diagnosis not present

## 2015-09-03 DIAGNOSIS — M797 Fibromyalgia: Secondary | ICD-10-CM | POA: Diagnosis not present

## 2015-09-03 DIAGNOSIS — F329 Major depressive disorder, single episode, unspecified: Secondary | ICD-10-CM | POA: Diagnosis not present

## 2015-09-03 DIAGNOSIS — Z6830 Body mass index (BMI) 30.0-30.9, adult: Secondary | ICD-10-CM | POA: Diagnosis not present

## 2015-09-04 DIAGNOSIS — L821 Other seborrheic keratosis: Secondary | ICD-10-CM | POA: Diagnosis not present

## 2015-09-04 DIAGNOSIS — E784 Other hyperlipidemia: Secondary | ICD-10-CM | POA: Diagnosis not present

## 2015-09-04 DIAGNOSIS — L812 Freckles: Secondary | ICD-10-CM | POA: Diagnosis not present

## 2015-09-11 ENCOUNTER — Encounter: Payer: PPO | Admitting: Internal Medicine

## 2015-09-12 ENCOUNTER — Encounter: Payer: Self-pay | Admitting: Internal Medicine

## 2015-09-24 ENCOUNTER — Encounter: Payer: Self-pay | Admitting: Internal Medicine

## 2015-09-24 ENCOUNTER — Ambulatory Visit (AMBULATORY_SURGERY_CENTER): Payer: PPO | Admitting: Internal Medicine

## 2015-09-24 ENCOUNTER — Encounter: Payer: PPO | Admitting: Internal Medicine

## 2015-09-24 VITALS — BP 127/71 | HR 67 | Temp 98.2°F | Resp 12 | Ht 71.0 in | Wt 218.0 lb

## 2015-09-24 DIAGNOSIS — D509 Iron deficiency anemia, unspecified: Secondary | ICD-10-CM | POA: Diagnosis not present

## 2015-09-24 DIAGNOSIS — K5730 Diverticulosis of large intestine without perforation or abscess without bleeding: Secondary | ICD-10-CM | POA: Diagnosis not present

## 2015-09-24 DIAGNOSIS — Z86010 Personal history of colonic polyps: Secondary | ICD-10-CM

## 2015-09-24 DIAGNOSIS — G4733 Obstructive sleep apnea (adult) (pediatric): Secondary | ICD-10-CM | POA: Diagnosis not present

## 2015-09-24 DIAGNOSIS — K5010 Crohn's disease of large intestine without complications: Secondary | ICD-10-CM

## 2015-09-24 DIAGNOSIS — K219 Gastro-esophageal reflux disease without esophagitis: Secondary | ICD-10-CM | POA: Diagnosis not present

## 2015-09-24 DIAGNOSIS — K5090 Crohn's disease, unspecified, without complications: Secondary | ICD-10-CM | POA: Diagnosis not present

## 2015-09-24 MED ORDER — SODIUM CHLORIDE 0.9 % IV SOLN: 500.0000 mL | INTRAVENOUS | Status: DC

## 2015-09-24 NOTE — Op Note (Signed)
[facility] Patient Name: Elwell Cammisa Procedure Date: 09/24/2015 3:01 PM MRN: [id number] Endoscopist: Docia Chuck. Henrene Pastor , MD Age: 70 Referring MD:  Date of Birth: 1946-01-27 Gender: Male Procedure:                Colonoscopy Indications:              High risk colon cancer surveillance: Crohn's                            colitis of 8 (or more) years duration. Evidence of                            mild disease 2009. Negative examination 2012. On no                            medical therapy Medicines:                Monitored Anesthesia Care Procedure:                Pre-Anesthesia Assessment:                           - Prior to the procedure, a History and Physical                            was performed, and patient medications and                            allergies were reviewed. The patient's tolerance of                            previous anesthesia was also reviewed. The risks                            and benefits of the procedure and the sedation                            options and risks were discussed with the patient.                            All questions were answered, and informed consent                            was obtained. Prior Anticoagulants: The patient has                            taken no previous anticoagulant or antiplatelet                            agents. ASA Grade Assessment: II - A patient with                            mild systemic disease. After reviewing the risks  and benefits, the patient was deemed in                            satisfactory condition to undergo the procedure.                           After obtaining informed consent, the colonoscope                            was passed under direct vision. Throughout the                            procedure, the patient's blood pressure, pulse, and                            oxygen saturations were monitored continuously. The     Model CF-HQ190L [id number]) scope was introduced                            through the anus and advanced to the the cecum,                            identified by appendiceal orifice and ileocecal                            valve. The ileocecal valve, appendiceal orifice,                            and rectum were photographed. The quality of the                            bowel preparation was excellent. The colonoscopy                            was performed without difficulty. The patient                            tolerated the procedure well. The bowel preparation                            used was SUPREP. Digital rectal examination was                            unremarkable. Scope In: 3:11:24 PM Scope Out: 3:24:44 PM Scope Withdrawal Time: 0 hours 8 minutes 2 seconds  Total Procedure Duration: 0 hours 13 minutes 20 seconds  Findings:                 Multiple large-mouthed diverticula were found in                            the left colon and right colon.                           The exam was otherwise without abnormality on  direct and retroflexion views. Small internal                            hemorrhoids. No active Crohn's. Complications:            No immediate complications. Estimated blood loss:                            None. Estimated Blood Loss:     Estimated blood loss: none. Impression:               - Diverticulosis in the left colon and in the right                            colon.                           - The examination was otherwise normal on direct                            and retroflexion views.                           - No specimens collected. Recommendation:           - Repeat colonoscopy in 5 years for surveillance. Docia Chuck. Henrene Pastor, MD 09/24/2015 3:32:56 PM This report has been signed electronically. CC Letter to:             Crist Infante, M.D.

## 2015-09-24 NOTE — Patient Instructions (Signed)
YOU HAD AN ENDOSCOPIC PROCEDURE TODAY AT THE [facility]:   Refer to the procedure report that was given to you for any specific questions about what was found during the examination.  If the procedure report does not answer your questions, please call your gastroenterologist to clarify.  If you requested that your care partner not be given the details of your procedure findings, then the procedure report has been included in a sealed envelope for you to review at your convenience later.  YOU SHOULD EXPECT: Some feelings of bloating in the abdomen. Passage of more gas than usual.  Walking can help get rid of the air that was put into your GI tract during the procedure and reduce the bloating. If you had a lower endoscopy (such as a colonoscopy or flexible sigmoidoscopy) you may notice spotting of blood in your stool or on the toilet paper. If you underwent a bowel prep for your procedure, you may not have a normal bowel movement for a few days.  Please Note:  You might notice some irritation and congestion in your nose or some drainage.  This is from the oxygen used during your procedure.  There is no need for concern and it should clear up in a day or so.  SYMPTOMS TO REPORT IMMEDIATELY:   Following lower endoscopy (colonoscopy or flexible sigmoidoscopy):  Excessive amounts of blood in the stool  Significant tenderness or worsening of abdominal pains  Swelling of the abdomen that is new, acute  Fever of 100F or higher   For urgent or emergent issues, a gastroenterologist can be reached at any hour by calling [phone].   DIET: Your first meal following the procedure should be a small meal and then it is ok to progress to your normal diet. Heavy or fried foods are harder to digest and may make you feel nauseous or bloated.  Likewise, meals heavy in dairy and vegetables can increase bloating.  Drink plenty of fluids but you should avoid alcoholic beverages for 24  hours.  ACTIVITY:  You should plan to take it easy for the rest of today and you should NOT DRIVE or use heavy machinery until tomorrow (because of the sedation medicines used during the test).    FOLLOW UP: Our staff will call the number listed on your records the next business day following your procedure to check on you and address any questions or concerns that you may have regarding the information given to you following your procedure. If we do not reach you, we will leave a message.  However, if you are feeling well and you are not experiencing any problems, there is no need to return our call.  We will assume that you have returned to your regular daily activities without incident.  If any biopsies were taken you will be contacted by phone or by letter within the next 1-3 weeks.  Please call us at [phone] if you have not heard about the biopsies in 3 weeks.    SIGNATURES/CONFIDENTIALITY: You and/or your care partner have signed paperwork which will be entered into your electronic medical record.  These signatures attest to the fact that that the information above on your After Visit Summary has been reviewed and is understood.  Full responsibility of the confidentiality of this discharge information lies with you and/or your care-partner.  Please review diverticulosis and high fiber diet handouts provided. 

## 2015-09-24 NOTE — Progress Notes (Signed)
Report given to PACU RN, vss 

## 2015-09-25 ENCOUNTER — Telehealth: Payer: Self-pay | Admitting: *Deleted

## 2015-09-25 NOTE — Telephone Encounter (Signed)
  Follow up Call-  Call back number 09/24/2015  Post procedure Call Back phone  # [phone]  Permission to leave phone message Yes     Patient questions:  Do you have a fever, pain , or abdominal swelling? No. Pain Score  0 *  Have you tolerated food without any problems? Yes.    Have you been able to return to your normal activities? No.  Do you have any questions about your discharge instructions: Diet   No. Medications  No. Follow up visit  No.  Do you have questions or concerns about your Care? No.  Actions: * If pain score is 4 or above: No action needed, pain <4.

## 2015-10-15 ENCOUNTER — Other Ambulatory Visit: Payer: Self-pay | Admitting: Internal Medicine

## 2015-12-05 DIAGNOSIS — F329 Major depressive disorder, single episode, unspecified: Secondary | ICD-10-CM | POA: Diagnosis not present

## 2015-12-05 DIAGNOSIS — I2510 Atherosclerotic heart disease of native coronary artery without angina pectoris: Secondary | ICD-10-CM | POA: Diagnosis not present

## 2015-12-05 DIAGNOSIS — M797 Fibromyalgia: Secondary | ICD-10-CM | POA: Diagnosis not present

## 2015-12-05 DIAGNOSIS — Z6829 Body mass index (BMI) 29.0-29.9, adult: Secondary | ICD-10-CM | POA: Diagnosis not present

## 2015-12-05 DIAGNOSIS — E784 Other hyperlipidemia: Secondary | ICD-10-CM | POA: Diagnosis not present

## 2015-12-05 DIAGNOSIS — R7301 Impaired fasting glucose: Secondary | ICD-10-CM | POA: Diagnosis not present

## 2015-12-05 DIAGNOSIS — E538 Deficiency of other specified B group vitamins: Secondary | ICD-10-CM | POA: Diagnosis not present

## 2015-12-05 DIAGNOSIS — I7090 Unspecified atherosclerosis: Secondary | ICD-10-CM | POA: Diagnosis not present

## 2015-12-05 DIAGNOSIS — Z1389 Encounter for screening for other disorder: Secondary | ICD-10-CM | POA: Diagnosis not present

## 2016-03-12 ENCOUNTER — Other Ambulatory Visit: Payer: Self-pay | Admitting: Internal Medicine

## 2016-04-30 DIAGNOSIS — M4854XA Collapsed vertebra, not elsewhere classified, thoracic region, initial encounter for fracture: Secondary | ICD-10-CM | POA: Diagnosis not present

## 2016-04-30 DIAGNOSIS — M25551 Pain in right hip: Secondary | ICD-10-CM | POA: Diagnosis not present

## 2016-04-30 DIAGNOSIS — M8588 Other specified disorders of bone density and structure, other site: Secondary | ICD-10-CM | POA: Diagnosis not present

## 2016-04-30 DIAGNOSIS — M4184 Other forms of scoliosis, thoracic region: Secondary | ICD-10-CM | POA: Diagnosis not present

## 2016-04-30 DIAGNOSIS — M25552 Pain in left hip: Secondary | ICD-10-CM | POA: Diagnosis not present

## 2016-05-07 DIAGNOSIS — M47816 Spondylosis without myelopathy or radiculopathy, lumbar region: Secondary | ICD-10-CM | POA: Diagnosis not present

## 2016-05-07 DIAGNOSIS — M4855XA Collapsed vertebra, not elsewhere classified, thoracolumbar region, initial encounter for fracture: Secondary | ICD-10-CM | POA: Diagnosis not present

## 2016-05-07 DIAGNOSIS — M47817 Spondylosis without myelopathy or radiculopathy, lumbosacral region: Secondary | ICD-10-CM | POA: Diagnosis not present

## 2016-06-04 DIAGNOSIS — M810 Age-related osteoporosis without current pathological fracture: Secondary | ICD-10-CM | POA: Diagnosis not present

## 2016-06-04 DIAGNOSIS — Z23 Encounter for immunization: Secondary | ICD-10-CM | POA: Diagnosis not present

## 2016-06-04 DIAGNOSIS — Z6828 Body mass index (BMI) 28.0-28.9, adult: Secondary | ICD-10-CM | POA: Diagnosis not present

## 2016-07-03 DIAGNOSIS — R7301 Impaired fasting glucose: Secondary | ICD-10-CM | POA: Diagnosis not present

## 2016-07-03 DIAGNOSIS — E559 Vitamin D deficiency, unspecified: Secondary | ICD-10-CM | POA: Diagnosis not present

## 2016-07-03 DIAGNOSIS — E538 Deficiency of other specified B group vitamins: Secondary | ICD-10-CM | POA: Diagnosis not present

## 2016-07-03 DIAGNOSIS — Z0000 Encounter for general adult medical examination without abnormal findings: Secondary | ICD-10-CM | POA: Diagnosis not present

## 2016-07-03 DIAGNOSIS — E784 Other hyperlipidemia: Secondary | ICD-10-CM | POA: Diagnosis not present

## 2016-07-03 DIAGNOSIS — M109 Gout, unspecified: Secondary | ICD-10-CM | POA: Diagnosis not present

## 2016-07-03 DIAGNOSIS — Z125 Encounter for screening for malignant neoplasm of prostate: Secondary | ICD-10-CM | POA: Diagnosis not present

## 2016-07-10 DIAGNOSIS — M25552 Pain in left hip: Secondary | ICD-10-CM | POA: Diagnosis not present

## 2016-07-10 DIAGNOSIS — Z1389 Encounter for screening for other disorder: Secondary | ICD-10-CM | POA: Diagnosis not present

## 2016-07-10 DIAGNOSIS — I7090 Unspecified atherosclerosis: Secondary | ICD-10-CM | POA: Diagnosis not present

## 2016-07-10 DIAGNOSIS — M25551 Pain in right hip: Secondary | ICD-10-CM | POA: Diagnosis not present

## 2016-07-10 DIAGNOSIS — I2510 Atherosclerotic heart disease of native coronary artery without angina pectoris: Secondary | ICD-10-CM | POA: Diagnosis not present

## 2016-07-10 DIAGNOSIS — Z0000 Encounter for general adult medical examination without abnormal findings: Secondary | ICD-10-CM | POA: Diagnosis not present

## 2016-07-10 DIAGNOSIS — M545 Low back pain: Secondary | ICD-10-CM | POA: Diagnosis not present

## 2016-07-10 DIAGNOSIS — Z6829 Body mass index (BMI) 29.0-29.9, adult: Secondary | ICD-10-CM | POA: Diagnosis not present

## 2016-07-10 DIAGNOSIS — M810 Age-related osteoporosis without current pathological fracture: Secondary | ICD-10-CM | POA: Diagnosis not present

## 2016-07-10 DIAGNOSIS — D6489 Other specified anemias: Secondary | ICD-10-CM | POA: Diagnosis not present

## 2016-07-10 DIAGNOSIS — I638 Other cerebral infarction: Secondary | ICD-10-CM | POA: Diagnosis not present

## 2016-07-10 DIAGNOSIS — K5090 Crohn's disease, unspecified, without complications: Secondary | ICD-10-CM | POA: Diagnosis not present

## 2016-09-29 DIAGNOSIS — E784 Other hyperlipidemia: Secondary | ICD-10-CM | POA: Diagnosis not present

## 2016-09-29 DIAGNOSIS — Z6829 Body mass index (BMI) 29.0-29.9, adult: Secondary | ICD-10-CM | POA: Diagnosis not present

## 2016-09-29 DIAGNOSIS — F3289 Other specified depressive episodes: Secondary | ICD-10-CM | POA: Diagnosis not present

## 2016-09-29 DIAGNOSIS — R221 Localized swelling, mass and lump, neck: Secondary | ICD-10-CM | POA: Diagnosis not present

## 2016-09-29 DIAGNOSIS — I638 Other cerebral infarction: Secondary | ICD-10-CM | POA: Diagnosis not present

## 2016-09-29 DIAGNOSIS — R7301 Impaired fasting glucose: Secondary | ICD-10-CM | POA: Diagnosis not present

## 2016-09-30 DIAGNOSIS — F3289 Other specified depressive episodes: Secondary | ICD-10-CM | POA: Diagnosis not present

## 2017-01-07 DIAGNOSIS — I638 Other cerebral infarction: Secondary | ICD-10-CM | POA: Diagnosis not present

## 2017-01-07 DIAGNOSIS — R7301 Impaired fasting glucose: Secondary | ICD-10-CM | POA: Diagnosis not present

## 2017-01-07 DIAGNOSIS — E784 Other hyperlipidemia: Secondary | ICD-10-CM | POA: Diagnosis not present

## 2017-01-07 DIAGNOSIS — Z6829 Body mass index (BMI) 29.0-29.9, adult: Secondary | ICD-10-CM | POA: Diagnosis not present

## 2017-01-07 DIAGNOSIS — F3289 Other specified depressive episodes: Secondary | ICD-10-CM | POA: Diagnosis not present

## 2017-02-12 ENCOUNTER — Encounter (HOSPITAL_COMMUNITY): Payer: Self-pay | Admitting: Emergency Medicine

## 2017-02-12 ENCOUNTER — Inpatient Hospital Stay (HOSPITAL_COMMUNITY)
Admission: EM | Admit: 2017-02-12 | Discharge: 2017-02-14 | DRG: 070 | Disposition: A | Discharge status: Home / Self Care | Payer: PPO | Attending: Internal Medicine | Admitting: Internal Medicine

## 2017-02-12 DIAGNOSIS — Z87891 Personal history of nicotine dependence: Secondary | ICD-10-CM | POA: Diagnosis not present

## 2017-02-12 DIAGNOSIS — R410 Disorientation, unspecified: Secondary | ICD-10-CM | POA: Diagnosis not present

## 2017-02-12 DIAGNOSIS — E860 Dehydration: Secondary | ICD-10-CM | POA: Diagnosis present

## 2017-02-12 DIAGNOSIS — F419 Anxiety disorder, unspecified: Secondary | ICD-10-CM | POA: Diagnosis present

## 2017-02-12 DIAGNOSIS — R1110 Vomiting, unspecified: Secondary | ICD-10-CM | POA: Diagnosis present

## 2017-02-12 DIAGNOSIS — M069 Rheumatoid arthritis, unspecified: Secondary | ICD-10-CM | POA: Diagnosis present

## 2017-02-12 DIAGNOSIS — N400 Enlarged prostate without lower urinary tract symptoms: Secondary | ICD-10-CM | POA: Diagnosis present

## 2017-02-12 DIAGNOSIS — K529 Noninfective gastroenteritis and colitis, unspecified: Secondary | ICD-10-CM | POA: Diagnosis not present

## 2017-02-12 DIAGNOSIS — I7300 Raynaud's syndrome without gangrene: Secondary | ICD-10-CM | POA: Diagnosis present

## 2017-02-12 DIAGNOSIS — K219 Gastro-esophageal reflux disease without esophagitis: Secondary | ICD-10-CM | POA: Diagnosis present

## 2017-02-12 DIAGNOSIS — G9340 Encephalopathy, unspecified: Secondary | ICD-10-CM | POA: Diagnosis present

## 2017-02-12 DIAGNOSIS — K226 Gastro-esophageal laceration-hemorrhage syndrome: Secondary | ICD-10-CM | POA: Diagnosis present

## 2017-02-12 DIAGNOSIS — M797 Fibromyalgia: Secondary | ICD-10-CM | POA: Diagnosis present

## 2017-02-12 DIAGNOSIS — G9341 Metabolic encephalopathy: Principal | ICD-10-CM | POA: Diagnosis present

## 2017-02-12 DIAGNOSIS — E785 Hyperlipidemia, unspecified: Secondary | ICD-10-CM | POA: Diagnosis present

## 2017-02-12 DIAGNOSIS — Z79899 Other long term (current) drug therapy: Secondary | ICD-10-CM | POA: Diagnosis not present

## 2017-02-12 DIAGNOSIS — K449 Diaphragmatic hernia without obstruction or gangrene: Secondary | ICD-10-CM | POA: Diagnosis present

## 2017-02-12 DIAGNOSIS — K920 Hematemesis: Secondary | ICD-10-CM | POA: Diagnosis not present

## 2017-02-12 DIAGNOSIS — N179 Acute kidney failure, unspecified: Secondary | ICD-10-CM | POA: Diagnosis not present

## 2017-02-12 DIAGNOSIS — E876 Hypokalemia: Secondary | ICD-10-CM | POA: Diagnosis not present

## 2017-02-12 DIAGNOSIS — A084 Viral intestinal infection, unspecified: Secondary | ICD-10-CM | POA: Diagnosis present

## 2017-02-12 DIAGNOSIS — M109 Gout, unspecified: Secondary | ICD-10-CM | POA: Diagnosis present

## 2017-02-12 DIAGNOSIS — F1020 Alcohol dependence, uncomplicated: Secondary | ICD-10-CM | POA: Diagnosis present

## 2017-02-12 DIAGNOSIS — R112 Nausea with vomiting, unspecified: Secondary | ICD-10-CM | POA: Diagnosis not present

## 2017-02-12 DIAGNOSIS — F329 Major depressive disorder, single episode, unspecified: Secondary | ICD-10-CM | POA: Diagnosis present

## 2017-02-12 DIAGNOSIS — F10231 Alcohol dependence with withdrawal delirium: Secondary | ICD-10-CM | POA: Diagnosis not present

## 2017-02-12 DIAGNOSIS — R9431 Abnormal electrocardiogram [ECG] [EKG]: Secondary | ICD-10-CM | POA: Diagnosis not present

## 2017-02-12 LAB — COMPREHENSIVE METABOLIC PANEL
ALT: 14 U/L — AB (ref 17–63)
ANION GAP: 11 (ref 5–15)
AST: 19 U/L (ref 15–41)
Albumin: 4.1 g/dL (ref 3.5–5.0)
Alkaline Phosphatase: 78 U/L (ref 38–126)
BUN: 16 mg/dL (ref 6–20)
CHLORIDE: 98 mmol/L — AB (ref 101–111)
CO2: 33 mmol/L — AB (ref 22–32)
CREATININE: 1.38 mg/dL — AB (ref 0.61–1.24)
Calcium: 10.1 mg/dL (ref 8.9–10.3)
GFR calc non Af Amer: 50 mL/min — ABNORMAL LOW (ref >60)
GFR, EST AFRICAN AMERICAN: 58 mL/min — AB (ref >60)
Glucose, Bld: 103 mg/dL — ABNORMAL HIGH (ref 65–99)
Potassium: 4.2 mmol/L (ref 3.5–5.1)
SODIUM: 142 mmol/L (ref 135–145)
Total Bilirubin: 0.7 mg/dL (ref 0.3–1.2)
Total Protein: 7.6 g/dL (ref 6.5–8.1)

## 2017-02-12 LAB — LIPASE, BLOOD: LIPASE: 83 U/L — AB (ref 11–51)

## 2017-02-12 LAB — CBC
HCT: 44.8 % (ref 39.0–52.0)
HEMOGLOBIN: 14.9 g/dL (ref 13.0–17.0)
MCH: 30.2 pg (ref 26.0–34.0)
MCHC: 33.3 g/dL (ref 30.0–36.0)
MCV: 90.9 fL (ref 78.0–100.0)
PLATELETS: 259 10*3/uL (ref 150–400)
RBC: 4.93 MIL/uL (ref 4.22–5.81)
RDW: 12.8 % (ref 11.5–15.5)
WBC: 12.8 10*3/uL — ABNORMAL HIGH (ref 4.0–10.5)

## 2017-02-12 MED ORDER — LORAZEPAM 2 MG/ML IJ SOLN
1.0000 mg | Freq: Once | INTRAMUSCULAR | Status: AC
  Administered 2017-02-13: 1 mg via INTRAVENOUS
  Filled 2017-02-12: qty 1

## 2017-02-12 MED ORDER — PROMETHAZINE HCL 25 MG/ML IJ SOLN
6.2500 mg | Freq: Once | INTRAMUSCULAR | Status: AC
Start: 2017-02-12 — End: 2017-02-12
  Administered 2017-02-12: 6.25 mg via INTRAVENOUS
  Filled 2017-02-12: qty 1

## 2017-02-12 MED ORDER — PANTOPRAZOLE SODIUM 40 MG IV SOLR
40.0000 mg | Freq: Once | INTRAVENOUS | Status: AC
  Administered 2017-02-12: 40 mg via INTRAVENOUS
  Filled 2017-02-12: qty 40

## 2017-02-12 MED ORDER — SODIUM CHLORIDE 0.9 % IV BOLUS (SEPSIS)
1000.0000 mL | Freq: Once | INTRAVENOUS | Status: AC
  Administered 2017-02-12: 1000 mL via INTRAVENOUS
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
## 2017-02-12 NOTE — ED Triage Notes (Signed)
Pt having active vomiting in room  Pt states he sees streaks of blood in it

## 2017-02-12 NOTE — ED Notes (Signed)
Provider at bedside

## 2017-02-12 NOTE — ED Triage Notes (Signed)
Pt states he has been vomiting since Sunday  Pt states he is unable to hold anything down and is concerned that he is becoming dehydrated  Denies any other symptoms  Pt states his doctor gave him Zofran 8mg  ODT but it has not been helping

## 2017-02-13 ENCOUNTER — Emergency Department (HOSPITAL_COMMUNITY): Payer: PPO

## 2017-02-13 ENCOUNTER — Encounter (HOSPITAL_COMMUNITY): Payer: Self-pay

## 2017-02-13 DIAGNOSIS — A084 Viral intestinal infection, unspecified: Secondary | ICD-10-CM | POA: Diagnosis present

## 2017-02-13 DIAGNOSIS — F1020 Alcohol dependence, uncomplicated: Secondary | ICD-10-CM | POA: Diagnosis present

## 2017-02-13 DIAGNOSIS — I7300 Raynaud's syndrome without gangrene: Secondary | ICD-10-CM | POA: Diagnosis present

## 2017-02-13 DIAGNOSIS — E860 Dehydration: Secondary | ICD-10-CM | POA: Diagnosis present

## 2017-02-13 DIAGNOSIS — Z79899 Other long term (current) drug therapy: Secondary | ICD-10-CM | POA: Diagnosis not present

## 2017-02-13 DIAGNOSIS — N400 Enlarged prostate without lower urinary tract symptoms: Secondary | ICD-10-CM | POA: Diagnosis present

## 2017-02-13 DIAGNOSIS — G9341 Metabolic encephalopathy: Secondary | ICD-10-CM | POA: Diagnosis present

## 2017-02-13 DIAGNOSIS — M797 Fibromyalgia: Secondary | ICD-10-CM | POA: Diagnosis present

## 2017-02-13 DIAGNOSIS — R112 Nausea with vomiting, unspecified: Secondary | ICD-10-CM | POA: Diagnosis not present

## 2017-02-13 DIAGNOSIS — Z87891 Personal history of nicotine dependence: Secondary | ICD-10-CM | POA: Diagnosis not present

## 2017-02-13 DIAGNOSIS — K219 Gastro-esophageal reflux disease without esophagitis: Secondary | ICD-10-CM | POA: Diagnosis present

## 2017-02-13 DIAGNOSIS — E876 Hypokalemia: Secondary | ICD-10-CM | POA: Diagnosis not present

## 2017-02-13 DIAGNOSIS — F329 Major depressive disorder, single episode, unspecified: Secondary | ICD-10-CM | POA: Diagnosis present

## 2017-02-13 DIAGNOSIS — N179 Acute kidney failure, unspecified: Secondary | ICD-10-CM | POA: Diagnosis not present

## 2017-02-13 DIAGNOSIS — E785 Hyperlipidemia, unspecified: Secondary | ICD-10-CM | POA: Diagnosis present

## 2017-02-13 DIAGNOSIS — K920 Hematemesis: Secondary | ICD-10-CM | POA: Diagnosis not present

## 2017-02-13 DIAGNOSIS — R1110 Vomiting, unspecified: Secondary | ICD-10-CM | POA: Diagnosis present

## 2017-02-13 DIAGNOSIS — M069 Rheumatoid arthritis, unspecified: Secondary | ICD-10-CM | POA: Diagnosis present

## 2017-02-13 DIAGNOSIS — R410 Disorientation, unspecified: Secondary | ICD-10-CM | POA: Insufficient documentation

## 2017-02-13 DIAGNOSIS — F419 Anxiety disorder, unspecified: Secondary | ICD-10-CM | POA: Diagnosis present

## 2017-02-13 DIAGNOSIS — F10231 Alcohol dependence with withdrawal delirium: Secondary | ICD-10-CM | POA: Diagnosis not present

## 2017-02-13 DIAGNOSIS — K529 Noninfective gastroenteritis and colitis, unspecified: Secondary | ICD-10-CM | POA: Diagnosis not present

## 2017-02-13 DIAGNOSIS — M109 Gout, unspecified: Secondary | ICD-10-CM | POA: Diagnosis present

## 2017-02-13 DIAGNOSIS — K449 Diaphragmatic hernia without obstruction or gangrene: Secondary | ICD-10-CM | POA: Diagnosis present

## 2017-02-13 DIAGNOSIS — G9340 Encephalopathy, unspecified: Secondary | ICD-10-CM | POA: Diagnosis present

## 2017-02-13 DIAGNOSIS — K226 Gastro-esophageal laceration-hemorrhage syndrome: Secondary | ICD-10-CM | POA: Diagnosis present

## 2017-02-13 LAB — CBC
HCT: 40 % (ref 39.0–52.0)
Hemoglobin: 13.3 g/dL (ref 13.0–17.0)
MCH: 29.9 pg (ref 26.0–34.0)
MCHC: 33.3 g/dL (ref 30.0–36.0)
MCV: 89.9 fL (ref 78.0–100.0)
PLATELETS: 223 10*3/uL (ref 150–400)
RBC: 4.45 MIL/uL (ref 4.22–5.81)
RDW: 12.8 % (ref 11.5–15.5)
WBC: 11.8 10*3/uL — AB (ref 4.0–10.5)

## 2017-02-13 LAB — URINALYSIS, ROUTINE W REFLEX MICROSCOPIC
BILIRUBIN URINE: NEGATIVE
GLUCOSE, UA: NEGATIVE mg/dL
HGB URINE DIPSTICK: NEGATIVE
Ketones, ur: NEGATIVE mg/dL
Leukocytes, UA: NEGATIVE
Nitrite: NEGATIVE
PROTEIN: NEGATIVE mg/dL
Specific Gravity, Urine: 1.021 (ref 1.005–1.030)
pH: 7 (ref 5.0–8.0)

## 2017-02-13 LAB — BASIC METABOLIC PANEL
Anion gap: 10 (ref 5–15)
BUN: 14 mg/dL (ref 6–20)
CHLORIDE: 103 mmol/L (ref 101–111)
CO2: 29 mmol/L (ref 22–32)
CREATININE: 1.34 mg/dL — AB (ref 0.61–1.24)
Calcium: 9.2 mg/dL (ref 8.9–10.3)
GFR calc Af Amer: >60 mL/min (ref >60)
GFR calc non Af Amer: 52 mL/min — ABNORMAL LOW (ref >60)
Glucose, Bld: 135 mg/dL — ABNORMAL HIGH (ref 65–99)
POTASSIUM: 3.5 mmol/L (ref 3.5–5.1)
SODIUM: 142 mmol/L (ref 135–145)

## 2017-02-13 LAB — MAGNESIUM: MAGNESIUM: 1.7 mg/dL (ref 1.7–2.4)

## 2017-02-13 MED ORDER — SODIUM CHLORIDE 0.9 % IV SOLN
INTRAVENOUS | Status: DC
  Administered 2017-02-13 (×2): via INTRAVENOUS

## 2017-02-13 MED ORDER — FERROUS SULFATE 325 (65 FE) MG PO TABS
325.0000 mg | ORAL_TABLET | Freq: Two times a day (BID) | ORAL | Status: DC
  Administered 2017-02-13 – 2017-02-14 (×3): 325 mg via ORAL
  Filled 2017-02-13 (×4): qty 1

## 2017-02-13 MED ORDER — DULOXETINE HCL 60 MG PO CPEP
60.0000 mg | ORAL_CAPSULE | Freq: Every day | ORAL | Status: DC
  Administered 2017-02-13 – 2017-02-14 (×2): 60 mg via ORAL
  Filled 2017-02-13: qty 1
  Filled 2017-02-13: qty 2

## 2017-02-13 MED ORDER — ONDANSETRON HCL 4 MG PO TABS
4.0000 mg | ORAL_TABLET | Freq: Four times a day (QID) | ORAL | Status: DC | PRN
  Administered 2017-02-13: 4 mg via ORAL
  Filled 2017-02-13: qty 1

## 2017-02-13 MED ORDER — METRONIDAZOLE IN NACL 5-0.79 MG/ML-% IV SOLN
500.0000 mg | Freq: Three times a day (TID) | INTRAVENOUS | Status: DC
  Administered 2017-02-13 – 2017-02-14 (×3): 500 mg via INTRAVENOUS
  Filled 2017-02-13 (×4): qty 100

## 2017-02-13 MED ORDER — CIPROFLOXACIN IN D5W 400 MG/200ML IV SOLN
400.0000 mg | Freq: Two times a day (BID) | INTRAVENOUS | Status: DC
  Administered 2017-02-13 – 2017-02-14 (×2): 400 mg via INTRAVENOUS
  Filled 2017-02-13 (×2): qty 200

## 2017-02-13 MED ORDER — HALOPERIDOL LACTATE 5 MG/ML IJ SOLN
1.0000 mg | Freq: Once | INTRAMUSCULAR | Status: AC
  Administered 2017-02-13: 1 mg via INTRAVENOUS

## 2017-02-13 MED ORDER — IOPAMIDOL (ISOVUE-300) INJECTION 61%
100.0000 mL | Freq: Once | INTRAVENOUS | Status: AC | PRN
  Administered 2017-02-13: 100 mL via INTRAVENOUS

## 2017-02-13 MED ORDER — ACETAMINOPHEN 325 MG PO TABS: 650.0000 mg | ORAL_TABLET | Freq: Four times a day (QID) | ORAL | Status: DC | PRN

## 2017-02-13 MED ORDER — PANTOPRAZOLE SODIUM 40 MG IV SOLR
40.0000 mg | Freq: Two times a day (BID) | INTRAVENOUS | Status: DC
  Administered 2017-02-13 (×2): 40 mg via INTRAVENOUS
  Filled 2017-02-13 (×2): qty 40

## 2017-02-13 MED ORDER — BUPROPION HCL ER (XL) 300 MG PO TB24
300.0000 mg | ORAL_TABLET | Freq: Every day | ORAL | Status: DC
  Administered 2017-02-13 – 2017-02-14 (×2): 300 mg via ORAL
  Filled 2017-02-13: qty 1
  Filled 2017-02-13: qty 2

## 2017-02-13 MED ORDER — LORAZEPAM 1 MG PO TABS: 1.0000 mg | ORAL_TABLET | Freq: Four times a day (QID) | ORAL | Status: DC | PRN

## 2017-02-13 MED ORDER — ONDANSETRON HCL 4 MG/2ML IJ SOLN: 4.0000 mg | Freq: Four times a day (QID) | INTRAMUSCULAR | Status: DC | PRN

## 2017-02-13 MED ORDER — LORAZEPAM 2 MG/ML IJ SOLN: 1.0000 mg | Freq: Once | INTRAMUSCULAR | Status: DC

## 2017-02-13 MED ORDER — FOLIC ACID 1 MG PO TABS
1.0000 mg | ORAL_TABLET | Freq: Every day | ORAL | Status: DC
  Administered 2017-02-13 – 2017-02-14 (×2): 1 mg via ORAL
  Filled 2017-02-13 (×2): qty 1

## 2017-02-13 MED ORDER — PROMETHAZINE HCL 25 MG/ML IJ SOLN
6.2500 mg | Freq: Once | INTRAMUSCULAR | Status: AC
Start: 2017-02-13 — End: 2017-02-13
  Administered 2017-02-13: 6.25 mg via INTRAVENOUS
  Filled 2017-02-13: qty 1

## 2017-02-13 MED ORDER — HALOPERIDOL LACTATE 5 MG/ML IJ SOLN
2.0000 mg | Freq: Four times a day (QID) | INTRAMUSCULAR | Status: DC | PRN
  Administered 2017-02-13: 2 mg via INTRAVENOUS
  Filled 2017-02-13: qty 1

## 2017-02-13 MED ORDER — ADULT MULTIVITAMIN W/MINERALS CH
1.0000 | ORAL_TABLET | Freq: Every day | ORAL | Status: DC
  Administered 2017-02-13 – 2017-02-14 (×2): 1 via ORAL
  Filled 2017-02-13 (×2): qty 1

## 2017-02-13 MED ORDER — THIAMINE HCL 100 MG/ML IJ SOLN
Freq: Once | INTRAVENOUS | Status: AC
  Administered 2017-02-13: 03:00:00 via INTRAVENOUS
  Filled 2017-02-13: qty 1000

## 2017-02-13 MED ORDER — THIAMINE HCL 100 MG/ML IJ SOLN
100.0000 mg | Freq: Every day | INTRAMUSCULAR | Status: DC
  Administered 2017-02-13: 100 mg via INTRAVENOUS
  Filled 2017-02-13: qty 2

## 2017-02-13 MED ORDER — CHLORHEXIDINE GLUCONATE 0.12 % MT SOLN
15.0000 mL | Freq: Two times a day (BID) | OROMUCOSAL | Status: DC
  Administered 2017-02-14: 15 mL via OROMUCOSAL
  Filled 2017-02-13: qty 15

## 2017-02-13 MED ORDER — HALOPERIDOL LACTATE 5 MG/ML IJ SOLN: 3.0000 mg | Freq: Four times a day (QID) | INTRAMUSCULAR | Status: DC | PRN

## 2017-02-13 MED ORDER — ORAL CARE MOUTH RINSE
15.0000 mL | Freq: Two times a day (BID) | OROMUCOSAL | Status: DC
  Administered 2017-02-13 – 2017-02-14 (×2): 15 mL via OROMUCOSAL

## 2017-02-13 MED ORDER — IOPAMIDOL (ISOVUE-300) INJECTION 61%
INTRAVENOUS | Status: AC
  Administered 2017-02-13: 100 mL via INTRAVENOUS
  Filled 2017-02-13: qty 100

## 2017-02-13 MED ORDER — VITAMIN B-1 100 MG PO TABS
100.0000 mg | ORAL_TABLET | Freq: Every day | ORAL | Status: DC
  Administered 2017-02-14: 100 mg via ORAL
  Filled 2017-02-13: qty 1

## 2017-02-13 MED ORDER — VITAMIN B-12 1000 MCG PO TABS
1000.0000 ug | ORAL_TABLET | Freq: Every day | ORAL | Status: DC
  Administered 2017-02-13 – 2017-02-14 (×2): 1000 ug via ORAL
  Filled 2017-02-13 (×2): qty 1

## 2017-02-13 MED ORDER — PANTOPRAZOLE SODIUM 40 MG PO TBEC
80.0000 mg | DELAYED_RELEASE_TABLET | Freq: Every day | ORAL | Status: DC
  Administered 2017-02-13 – 2017-02-14 (×2): 80 mg via ORAL
  Filled 2017-02-13 (×2): qty 2

## 2017-02-13 MED ORDER — HYDRALAZINE HCL 20 MG/ML IJ SOLN: 10.0000 mg | Freq: Four times a day (QID) | INTRAMUSCULAR | Status: DC | PRN

## 2017-02-13 MED ORDER — LORAZEPAM 2 MG/ML IJ SOLN
1.0000 mg | Freq: Four times a day (QID) | INTRAMUSCULAR | Status: DC | PRN
  Administered 2017-02-13 (×2): 1 mg via INTRAVENOUS
  Filled 2017-02-13 (×2): qty 1

## 2017-02-13 MED ORDER — ACETAMINOPHEN 650 MG RE SUPP: 650.0000 mg | Freq: Four times a day (QID) | RECTAL | Status: DC | PRN

## 2017-02-13 MED ORDER — MAGNESIUM SULFATE 2 GM/50ML IV SOLN
2.0000 g | Freq: Once | INTRAVENOUS | Status: AC
  Administered 2017-02-13: 2 g via INTRAVENOUS
  Filled 2017-02-13: qty 50
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
## 2017-02-13 NOTE — ED Notes (Signed)
Patient acutely agitated-Dr. Alcario Drought at bedside/administered Haldol IV as ordered-son at bedside and aware of plan of care

## 2017-02-13 NOTE — Care Management Note (Signed)
Case Management Note  Patient Details  Name: Alexander Lambert MRN: [id number] Date of Birth: 1945-05-30  Subjective/Objective:  71 y/o m admitted w/Acute encephalopathy. From home.                  Action/Plan:d/c plan home.   Expected Discharge Date:   (unknown)               Expected Discharge Plan:  Home/Self Care  In-House Referral:     Discharge planning Services  CM Consult  Post Acute Care Choice:    Choice offered to:     DME Arranged:    DME Agency:     HH Arranged:    HH Agency:     Status of Service:  In process, will continue to follow  If discussed at Long Length of Stay Meetings, dates discussed:    Additional Comments:  Dessa Phi, RN 02/13/2017, 1:44 PM

## 2017-02-13 NOTE — ED Notes (Signed)
Son Alexander Lambert request to be contacted if status of his father changes at [phone]

## 2017-02-13 NOTE — Progress Notes (Signed)
Improvement in agitation with 2mg  haldol.  HR 120s still.  Will give 3rd mg, and up to 3mg  Q6H PRN IV.  Do still want him on CIWA though as there could be component of DTs.

## 2017-02-13 NOTE — ED Notes (Signed)
Patient in CT

## 2017-02-13 NOTE — ED Notes (Signed)
Contact is son: Ismeal Heider [phone]

## 2017-02-13 NOTE — ED Notes (Signed)
Dr. Gardner at bedside 

## 2017-02-13 NOTE — ED Notes (Signed)
Pt family member to desk asking to help get his Dad in bed. Pt had urinated on the floor and was pulling off leads. Had pt lay back in bed and I put the rail up and covered pt up.

## 2017-02-13 NOTE — ED Notes (Signed)
Jerene Pitch [phone] report [phone]

## 2017-02-13 NOTE — Progress Notes (Signed)
Patient seen and examined  CT abdomen pelvis reviewed-suggestive of enteritis, infectious versus inflammatory which is probably the reason for patient's intractable nausea vomiting  Patient has not had any further episodes of  nausea or vomiting   Hemoglobin stable  Plan Started on Cipro Flagyl, IV fluids Continue him on clear liquid diet Monitor closely on CIWA protocol

## 2017-02-14 DIAGNOSIS — K529 Noninfective gastroenteritis and colitis, unspecified: Secondary | ICD-10-CM

## 2017-02-14 DIAGNOSIS — R410 Disorientation, unspecified: Secondary | ICD-10-CM

## 2017-02-14 DIAGNOSIS — G9340 Encephalopathy, unspecified: Secondary | ICD-10-CM

## 2017-02-14 DIAGNOSIS — R112 Nausea with vomiting, unspecified: Secondary | ICD-10-CM

## 2017-02-14 DIAGNOSIS — F1020 Alcohol dependence, uncomplicated: Secondary | ICD-10-CM

## 2017-02-14 DIAGNOSIS — K920 Hematemesis: Secondary | ICD-10-CM

## 2017-02-14 LAB — URINE CULTURE: Culture: NO GROWTH

## 2017-02-14 LAB — CBC
HEMATOCRIT: 33.9 % — AB (ref 39.0–52.0)
Hemoglobin: 11.2 g/dL — ABNORMAL LOW (ref 13.0–17.0)
MCH: 29.9 pg (ref 26.0–34.0)
MCHC: 33 g/dL (ref 30.0–36.0)
MCV: 90.4 fL (ref 78.0–100.0)
Platelets: 187 10*3/uL (ref 150–400)
RBC: 3.75 MIL/uL — ABNORMAL LOW (ref 4.22–5.81)
RDW: 12.8 % (ref 11.5–15.5)
WBC: 6.8 10*3/uL (ref 4.0–10.5)

## 2017-02-14 LAB — COMPREHENSIVE METABOLIC PANEL
ALBUMIN: 3.3 g/dL — AB (ref 3.5–5.0)
ALT: 16 U/L — ABNORMAL LOW (ref 17–63)
ANION GAP: 5 (ref 5–15)
AST: 20 U/L (ref 15–41)
Alkaline Phosphatase: 63 U/L (ref 38–126)
BILIRUBIN TOTAL: 0.9 mg/dL (ref 0.3–1.2)
BUN: 11 mg/dL (ref 6–20)
CO2: 29 mmol/L (ref 22–32)
Calcium: 8 mg/dL — ABNORMAL LOW (ref 8.9–10.3)
Chloride: 105 mmol/L (ref 101–111)
Creatinine, Ser: 1.24 mg/dL (ref 0.61–1.24)
GFR calc Af Amer: >60 mL/min (ref >60)
GFR calc non Af Amer: 57 mL/min — ABNORMAL LOW (ref >60)
GLUCOSE: 110 mg/dL — AB (ref 65–99)
POTASSIUM: 3.4 mmol/L — AB (ref 3.5–5.1)
Sodium: 139 mmol/L (ref 135–145)
TOTAL PROTEIN: 6 g/dL — AB (ref 6.5–8.1)

## 2017-02-14 LAB — LIPASE, BLOOD: Lipase: 26 U/L (ref 11–51)

## 2017-02-14 MED ORDER — THIAMINE HCL 100 MG PO TABS: 100.0000 mg | ORAL_TABLET | Freq: Every day | ORAL | 0 refills | Status: DC

## 2017-02-14 MED ORDER — POTASSIUM CHLORIDE CRYS ER 20 MEQ PO TBCR
40.0000 meq | EXTENDED_RELEASE_TABLET | Freq: Two times a day (BID) | ORAL | Status: DC
  Administered 2017-02-14: 40 meq via ORAL
  Filled 2017-02-14: qty 2

## 2017-02-14 MED ORDER — ADULT MULTIVITAMIN W/MINERALS CH: 1.0000 | ORAL_TABLET | Freq: Every day | ORAL | 0 refills | Status: DC

## 2017-02-14 MED ORDER — FOLIC ACID 1 MG PO TABS: 1.0000 mg | ORAL_TABLET | Freq: Every day | ORAL | 0 refills | Status: DC

## 2017-02-14 NOTE — Progress Notes (Signed)
Pt given discharge instructions including medication teaching. Pt verbalized understanding of all discharge instructions. Discharge packet with prescription with Pt at time of discahrge.

## 2017-02-14 NOTE — Discharge Summary (Signed)
Physician Discharge Summary  Alexander Lambert WUG:[id number] DOB: 03/26/1946 DOA: 02/12/2017  PCP: Crist Infante, MD  Admit date: 02/12/2017 Discharge date: 02/14/2017  Admitted From: Home Disposition: Home  Recommendations for Outpatient Follow-up:  1. Follow up with PCP in 1-2 weeks 2. Follow up with Gastroenterology Dr. Henrene Pastor as an outpatient 3. Cut down on Alcoholic Beverages 4. Please obtain CMP/CBC, Mag, Phos in one week  Home Health: No Equipment/Devices: None    Discharge Condition: Stable CODE STATUS: FULL CODE Diet recommendation: Soft Diet  Brief/Interim Summary: Alexander Lambert is a 71 y.o. male with medical history significant of Esophageal stricture, RA, EtOH use, drinks 6-8 beers daily.  Patient has had N/V since Sunday.  Hasnt been able to hold anything down until yesterday.  Family became concerned for dehydration. In the ED started having reported some hematemesis with vomiting as well.  Blood streaked by son report but none since. In the ED Got phenergan and ativan and vomiting seemed to have resolved with no further episodes. Became agitated and delirious and is trying to climb out of bed but improved. Abdominal CT is essentially negative (just questionable mild enteritis). His nausea and vomiting improved and had no more Hematemesis; Tolerated his diet and CIWA was negative. Deemed medically stable to D/C Home and follow up with PCP and Gastroenterology as an outpatient.   Discharge Diagnoses:  Principal Problem:   Acute encephalopathy Active Problems:   Nausea & vomiting   EtOH dependence (HCC)   Hematemesis   Enteritis  Acute Encephalopathy likely from Dehydration for Acute Nausea and Vomiting -Improved -Delirium due to N/V, vs drug induced from phenergan, vs EtOH withdrawal; Most likely Nausea and Vomiting  -2mg  haldol Q6H PRN ordered -Tele monitor, watched QTc -Placed on CIWA and last CIWA Score was 3  Nausea and Vomiting -Improved -Avoided phenergan and use  zofran instead -Mild enteritis on CT showing infectious vs. Inflammatory which is probably the reason for patient's Intractable N/V -Got 1L NS; banana bag ordered for 2nd Liter -Patient's Diet advanced from Clear Liquids to Soft Diet and tolerated that well -Started on IV Cipro/Flagyl yesterday but will D/C as believed to be Viral Gastroenteritis  -Follow up with PCP and restart Abx if worsens   ? Hematemesis  -If it was present, story is c/w likley with small mallory-weis tear at this point -No further episodes recently -HGB 14 on presentation and dropped to 11.2/33.9 (likely dilutional drop) -Continue home PPI, got IV protonix dose in ED -Follow up with GI as an outpatient I if further evidence of ongoing GI bleed or if GI symptoms persist -Last EGD 2014 by Dr. Henrene Pastor: Large Hiatal hernia, stricture that got dilated, and Cameron erosions. -Last colonoscopy was last year. -Clear liquids advanced to Soft Diet and tolerated that well  EtOH Dependence  -Initially suspected withdrawal -CIWA -Banana bag ordered x1; C/w Folic Acid, MVI and Thiamine at D/C -See #1 above  Leukocytosis -Likely from Enteritis and N/V -Improved WBC went from 12.8 -> 6.8 -Continue to Monitor for S/Sx of Infection and Follow up with PCP to repeat Blood work -Will D/C Abx -Follow up with PCP   Hypokalemia -Patient's K+ was 3.4 this AM -Replete prior to D/C -Continue to Monitor and Replete as Necessary -Repeat CMP as an outpatient  Mild AKI -Likely from Dehydration/N/V -Improved with IVF -BUN/Cr went from 16/1.38 -> 11/1.24 -Continue to Monitor and Repeat CMP as an outpatient   Discharge Instructions  Discharge Instructions    Call MD for:  difficulty breathing, headache or visual disturbances    Complete by:  As directed    Call MD for:  extreme fatigue    Complete by:  As directed    Call MD for:  hives    Complete by:  As directed    Call MD for:  persistant dizziness or light-headedness     Complete by:  As directed    Call MD for:  persistant nausea and vomiting    Complete by:  As directed    Call MD for:  redness, tenderness, or signs of infection (pain, swelling, redness, odor or green/yellow discharge around incision site)    Complete by:  As directed    Call MD for:  severe uncontrolled pain    Complete by:  As directed    Call MD for:  temperature >100.4    Complete by:  As directed    Diet - low sodium heart healthy    Complete by:  As directed    Discharge instructions    Complete by:  As directed    Follow up with PCP and Gastroenterology as an outpatient. Take all medications as prescribed. If symptoms change or worsen please return to the ED for evaluation.   Increase activity slowly    Complete by:  As directed      Allergies as of 02/14/2017   No Known Allergies     Medication List    TAKE these medications   ALPRAZolam 0.5 MG tablet Commonly known as:  XANAX Take 0.5-1.5 mg by mouth at bedtime as needed for sleep.   Co-Enzyme Q10 200 MG Caps Take 200 mg by mouth daily.   DULoxetine 60 MG capsule Commonly known as:  CYMBALTA Take 60 mg by mouth daily.   ergocalciferol 50000 units capsule Commonly known as:  VITAMIN D2 Take 50,000 Units by mouth 2 (two) times a week.   ferrous sulfate 325 (65 FE) MG tablet TAKE ONE TABLET TWICE DAILY   folic acid 1 MG tablet Commonly known as:  FOLVITE Take 1 tablet (1 mg total) by mouth daily.   multivitamin with minerals Tabs tablet Take 1 tablet by mouth daily.   omeprazole 40 MG capsule Commonly known as:  PRILOSEC TAKE ONE CAPSULE EACH DAY   ondansetron 8 MG disintegrating tablet Commonly known as:  ZOFRAN-ODT Take 8 mg by mouth every 8 (eight) hours as needed for nausea/vomiting.   thiamine 100 MG tablet Take 1 tablet (100 mg total) by mouth daily.   vitamin B-12 1000 MCG tablet Commonly known as:  CYANOCOBALAMIN Take 1,000 mcg by mouth daily.   WELLBUTRIN XL 300 MG 24 hr  tablet Generic drug:  buPROPion Take 300 mg by mouth daily.       No Known Allergies  Consultations:  None  Procedures/Studies: Ct Abdomen Pelvis W Contrast  Result Date: 02/13/2017 CLINICAL DATA:  Nausea and vomiting 5 days. EXAM: CT ABDOMEN AND PELVIS WITH CONTRAST TECHNIQUE: Multidetector CT imaging of the abdomen and pelvis was performed using the standard protocol following bolus administration of intravenous contrast. CONTRAST:  100 mL Isovue-300 IV. COMPARISON:  None. FINDINGS: Lower chest: Lung bases are within normal. Large hiatal hernia as most of the stomach is within the thoracic cavity. Hepatobiliary: Previous cholecystectomy. Liver and biliary tree are normal. Pancreas: Within normal. Spleen: Within normal. Adrenals/Urinary Tract: Adrenal glands are normal. Kidneys are normal in size without hydronephrosis or nephrolithiasis per 2 small subcentimeter left renal cortical hypodensities and 1 small right renal subcentimeter  hypodensity too small to characterize, but likely cysts. Ureters and bladder are normal. Stomach/Bowel: Large hiatal hernia with most the stomach within the lower mediastinum. Minimal [place] thickening of a few proximal jejunal loops. Appendix is normal. Note that the cecum is left of midline in the mid abdomen. Minimal diverticulosis of the colon. Vascular/Lymphatic: Moderate calcified plaque over the abdominal aorta and iliac arteries. Possible low-density node over the right retrocrural region measuring 1.6 cm by short axis. Reproductive: Within normal. Other: No free fluid or focal inflammatory change. Musculoskeletal: Mild degenerate change of the spine. Moderate T12 compression fracture. IMPRESSION: No acute findings in the abdomen/pelvis. Large hiatal hernia as most of the stomach is within the mediastinum. Minimal Clift thickening of a few proximal jejunal loops which is nonspecific and may be due to a regional enteritis of infectious or inflammatory nature.  Moderate T12 compression fracture. Few small subcentimeter renal cortical hypodensities too small to characterize but likely cysts. Aortic Atherosclerosis (ICD10-I70.0). Mild colonic diverticulosis. Electronically Signed   By: Marin Olp M.D.   On: 02/13/2017 00:58    Subjective: Seen and examined at bedside and improved. Was awake and alert and not encephalopathic. Improved and wanting to go home.  Discharge Exam: Vitals:   02/13/17 2123 02/14/17 0526  BP: 104/68 125/74  Pulse: 98 86  Resp: 18 18  Temp: 98.9 F (37.2 C) 98.3 F (36.8 C)  SpO2: 95% 94%   Vitals:   02/13/17 1332 02/13/17 1505 02/13/17 2123 02/14/17 0526  BP: (!) 174/87 (!) 145/85 104/68 125/74  Pulse: (!) 101  98 86  Resp: 14  18 18   Temp: 98.9 F (37.2 C)  98.9 F (37.2 C) 98.3 F (36.8 C)  TempSrc: Oral  Oral Oral  SpO2: 100%  95% 94%  Weight:      Height:       General: Pt is alert, awake, not in acute distress Cardiovascular: RRR, S1/S2 +, no rubs, no gallops Respiratory: CTA bilaterally, no wheezing, no rhonchi Abdominal: Soft, NT, ND, bowel sounds + Extremities: no edema, no cyanosis  The results of significant diagnostics from this hospitalization (including imaging, microbiology, ancillary and laboratory) are listed below for reference.    Microbiology: Recent Results (from the past 240 hour(s))  Urine culture     Status: None   Collection Time: 02/12/17 10:36 PM  Result Value Ref Range Status   Specimen Description URINE, CLEAN CATCH  Final   Special Requests NONE  Final   Culture   Final    NO GROWTH Performed at [facility], [address] [place], [place] [address]    Report Status 02/14/2017 FINAL  Final    Labs: BNP (last 3 results) No results for input(s): BNP in the last 8760 hours. Basic Metabolic Panel:  Recent Labs Lab 02/12/17 2034 02/13/17 0829 02/14/17 0456  NA 142 142 139  K 4.2 3.5 3.4*  CL 98* 103 105  CO2 33* 29 29  GLUCOSE 103* 135* 110*  BUN 16 14  11   CREATININE 1.38* 1.34* 1.24  CALCIUM 10.1 9.2 8.0*  MG  --  1.7  --    Liver Function Tests:  Recent Labs Lab 02/12/17 2034 02/14/17 0456  AST 19 20  ALT 14* 16*  ALKPHOS 78 63  BILITOT 0.7 0.9  PROT 7.6 6.0*  ALBUMIN 4.1 3.3*    Recent Labs Lab 02/12/17 2034 02/14/17 0456  LIPASE 83* 26   No results for input(s): AMMONIA in the last 168 hours.  CBC:  Recent Labs Lab 02/12/17 2034 02/13/17 0829 02/14/17 0456  WBC 12.8* 11.8* 6.8  HGB 14.9 13.3 11.2*  HCT 44.8 40.0 33.9*  MCV 90.9 89.9 90.4  PLT 259 223 187   Cardiac Enzymes: No results for input(s): CKTOTAL, CKMB, CKMBINDEX, TROPONINI in the last 168 hours. BNP: Invalid input(s): POCBNP CBG: No results for input(s): GLUCAP in the last 168 hours. D-Dimer No results for input(s): DDIMER in the last 72 hours. Hgb A1c No results for input(s): HGBA1C in the last 72 hours. Lipid Profile No results for input(s): CHOL, HDL, LDLCALC, TRIG, CHOLHDL, LDLDIRECT in the last 72 hours. Thyroid function studies No results for input(s): TSH, T4TOTAL, T3FREE, THYROIDAB in the last 72 hours.  Invalid input(s): FREET3 Anemia work up No results for input(s): VITAMINB12, FOLATE, FERRITIN, TIBC, IRON, RETICCTPCT in the last 72 hours. Urinalysis    Component Value Date/Time   COLORURINE YELLOW 02/13/2017 0250   APPEARANCEUR CLEAR 02/13/2017 0250   LABSPEC 1.021 02/13/2017 0250   PHURINE 7.0 02/13/2017 0250   GLUCOSEU NEGATIVE 02/13/2017 0250   HGBUR NEGATIVE 02/13/2017 0250   BILIRUBINUR NEGATIVE 02/13/2017 0250   KETONESUR NEGATIVE 02/13/2017 0250   PROTEINUR NEGATIVE 02/13/2017 0250   NITRITE NEGATIVE 02/13/2017 0250   LEUKOCYTESUR NEGATIVE 02/13/2017 0250   Sepsis Labs Invalid input(s): PROCALCITONIN,  WBC,  LACTICIDVEN Microbiology Recent Results (from the past 240 hour(s))  Urine culture     Status: None   Collection Time: 02/12/17 10:36 PM  Result Value Ref Range Status   Specimen Description URINE,  CLEAN CATCH  Final   Special Requests NONE  Final   Culture   Final    NO GROWTH Performed at [facility], [place] [address], [place], [place] [address]    Report Status 02/14/2017 FINAL  Final   Time coordinating discharge: 35 minutes  SIGNED:  Kerney Elbe, DO [facility] 02/14/2017, 7:25 PM Pager [phone]  If 7PM-7AM, please contact night-coverage [email or web address] Password [email or web address]

## 2017-02-17 ENCOUNTER — Other Ambulatory Visit: Payer: Self-pay | Admitting: Internal Medicine

## 2017-02-18 DIAGNOSIS — K5090 Crohn's disease, unspecified, without complications: Secondary | ICD-10-CM | POA: Diagnosis not present

## 2017-02-18 DIAGNOSIS — D6489 Other specified anemias: Secondary | ICD-10-CM | POA: Diagnosis not present

## 2017-02-18 DIAGNOSIS — Z23 Encounter for immunization: Secondary | ICD-10-CM | POA: Diagnosis not present

## 2017-02-26 ENCOUNTER — Ambulatory Visit (INDEPENDENT_AMBULATORY_CARE_PROVIDER_SITE_OTHER): Payer: PPO | Admitting: Physician Assistant

## 2017-02-26 ENCOUNTER — Encounter: Payer: Self-pay | Admitting: Physician Assistant

## 2017-02-26 VITALS — BP 124/64 | HR 80 | Ht 71.0 in | Wt 194.4 lb

## 2017-02-26 DIAGNOSIS — K449 Diaphragmatic hernia without obstruction or gangrene: Secondary | ICD-10-CM

## 2017-02-26 DIAGNOSIS — K920 Hematemesis: Secondary | ICD-10-CM

## 2017-02-26 DIAGNOSIS — R935 Abnormal findings on diagnostic imaging of other abdominal regions, including retroperitoneum: Secondary | ICD-10-CM

## 2017-02-26 DIAGNOSIS — K5900 Constipation, unspecified: Secondary | ICD-10-CM | POA: Diagnosis not present

## 2017-02-26 NOTE — Patient Instructions (Addendum)
Please purchase the following medications over the counter and take as directed: Start Miralax once daily.   We have given you a high fiber diet handout. Please strive to have 25-30 grams of fiber daily.
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
## 2017-02-26 NOTE — Progress Notes (Signed)
Assessment and plans reviewed  

## 2017-03-04 DIAGNOSIS — M5489 Other dorsalgia: Secondary | ICD-10-CM | POA: Diagnosis not present

## 2017-03-04 DIAGNOSIS — M797 Fibromyalgia: Secondary | ICD-10-CM | POA: Diagnosis not present

## 2017-03-04 DIAGNOSIS — F3289 Other specified depressive episodes: Secondary | ICD-10-CM | POA: Diagnosis not present

## 2017-03-04 DIAGNOSIS — Z6828 Body mass index (BMI) 28.0-28.9, adult: Secondary | ICD-10-CM | POA: Diagnosis not present

## 2017-03-15 DIAGNOSIS — R112 Nausea with vomiting, unspecified: Secondary | ICD-10-CM | POA: Diagnosis not present

## 2017-03-15 DIAGNOSIS — E538 Deficiency of other specified B group vitamins: Secondary | ICD-10-CM | POA: Diagnosis not present

## 2017-03-15 DIAGNOSIS — D6489 Other specified anemias: Secondary | ICD-10-CM | POA: Diagnosis not present

## 2017-03-15 DIAGNOSIS — Z6828 Body mass index (BMI) 28.0-28.9, adult: Secondary | ICD-10-CM | POA: Diagnosis not present

## 2017-03-15 DIAGNOSIS — K5090 Crohn's disease, unspecified, without complications: Secondary | ICD-10-CM | POA: Diagnosis not present

## 2017-03-15 DIAGNOSIS — F329 Major depressive disorder, single episode, unspecified: Secondary | ICD-10-CM | POA: Diagnosis not present

## 2017-03-15 DIAGNOSIS — F1099 Alcohol use, unspecified with unspecified alcohol-induced disorder: Secondary | ICD-10-CM | POA: Diagnosis not present

## 2017-03-15 DIAGNOSIS — I2510 Atherosclerotic heart disease of native coronary artery without angina pectoris: Secondary | ICD-10-CM | POA: Diagnosis not present

## 2017-03-15 DIAGNOSIS — E7849 Other hyperlipidemia: Secondary | ICD-10-CM | POA: Diagnosis not present

## 2017-03-15 DIAGNOSIS — K219 Gastro-esophageal reflux disease without esophagitis: Secondary | ICD-10-CM | POA: Diagnosis not present

## 2017-03-18 DIAGNOSIS — M1990 Unspecified osteoarthritis, unspecified site: Secondary | ICD-10-CM | POA: Diagnosis not present

## 2017-03-18 DIAGNOSIS — F1099 Alcohol use, unspecified with unspecified alcohol-induced disorder: Secondary | ICD-10-CM | POA: Diagnosis not present

## 2017-03-18 DIAGNOSIS — F3289 Other specified depressive episodes: Secondary | ICD-10-CM | POA: Diagnosis not present

## 2017-03-18 DIAGNOSIS — Z6828 Body mass index (BMI) 28.0-28.9, adult: Secondary | ICD-10-CM | POA: Diagnosis not present

## 2017-03-18 DIAGNOSIS — R112 Nausea with vomiting, unspecified: Secondary | ICD-10-CM | POA: Diagnosis not present

## 2017-04-06 DIAGNOSIS — F332 Major depressive disorder, recurrent severe without psychotic features: Secondary | ICD-10-CM | POA: Diagnosis not present

## 2017-04-13 ENCOUNTER — Encounter: Payer: Self-pay | Admitting: Internal Medicine

## 2017-04-13 ENCOUNTER — Ambulatory Visit: Payer: PPO | Admitting: Internal Medicine

## 2017-04-13 VITALS — BP 110/60 | HR 72 | Ht 70.0 in | Wt 199.0 lb

## 2017-04-13 DIAGNOSIS — K5900 Constipation, unspecified: Secondary | ICD-10-CM

## 2017-04-13 DIAGNOSIS — K5010 Crohn's disease of large intestine without complications: Secondary | ICD-10-CM

## 2017-04-13 DIAGNOSIS — K449 Diaphragmatic hernia without obstruction or gangrene: Secondary | ICD-10-CM | POA: Diagnosis not present

## 2017-04-13 DIAGNOSIS — K920 Hematemesis: Secondary | ICD-10-CM | POA: Diagnosis not present

## 2017-04-13 NOTE — Patient Instructions (Signed)
Use Miralax - adjust as needed for your constipation
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
## 2017-05-14 DIAGNOSIS — K219 Gastro-esophageal reflux disease without esophagitis: Secondary | ICD-10-CM | POA: Diagnosis not present

## 2017-05-14 DIAGNOSIS — F3289 Other specified depressive episodes: Secondary | ICD-10-CM | POA: Diagnosis not present

## 2017-05-14 DIAGNOSIS — R1319 Other dysphagia: Secondary | ICD-10-CM | POA: Diagnosis not present

## 2017-05-14 DIAGNOSIS — Z6828 Body mass index (BMI) 28.0-28.9, adult: Secondary | ICD-10-CM | POA: Diagnosis not present

## 2017-05-14 DIAGNOSIS — M1990 Unspecified osteoarthritis, unspecified site: Secondary | ICD-10-CM | POA: Diagnosis not present

## 2017-05-18 ENCOUNTER — Telehealth: Payer: Self-pay | Admitting: Internal Medicine

## 2017-05-18 NOTE — Telephone Encounter (Signed)
Patient reports frequent vomiting.  He would like to see Dr. Henrene Pastor.  I explained that Dr. Henrene Pastor is not in the office this week.  He will come in and see Alonza Bogus, PA on 05/20/17 and a follow up with Dr. Henrene Pastor on 06/19/17

## 2017-05-20 ENCOUNTER — Ambulatory Visit (INDEPENDENT_AMBULATORY_CARE_PROVIDER_SITE_OTHER): Payer: PPO | Admitting: Gastroenterology

## 2017-05-20 ENCOUNTER — Encounter: Payer: Self-pay | Admitting: Gastroenterology

## 2017-05-20 VITALS — BP 114/76 | HR 68 | Ht 69.0 in | Wt 197.2 lb

## 2017-05-20 DIAGNOSIS — K219 Gastro-esophageal reflux disease without esophagitis: Secondary | ICD-10-CM

## 2017-05-20 DIAGNOSIS — R1110 Vomiting, unspecified: Secondary | ICD-10-CM | POA: Diagnosis not present

## 2017-05-20 DIAGNOSIS — K5900 Constipation, unspecified: Secondary | ICD-10-CM | POA: Diagnosis not present

## 2017-05-20 DIAGNOSIS — R1310 Dysphagia, unspecified: Secondary | ICD-10-CM | POA: Diagnosis not present

## 2017-05-20 NOTE — Patient Instructions (Signed)
Add Benefiber or Citrucel daily.   You have been scheduled for an endoscopy. Please follow written instructions given to you at your visit today. If you use inhalers (even only as needed), please bring them with you on the day of your procedure. Your physician has requested that you go to www.startemmi.com and enter the access code given to you at your visit today. This web site gives a general overview about your procedure. However, you should still follow specific instructions given to you by our office regarding your preparation for the procedure.
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
## 2017-05-21 NOTE — Progress Notes (Signed)
Assessment and plans reviewed  

## 2017-05-28 ENCOUNTER — Encounter: Payer: Self-pay | Admitting: Internal Medicine

## 2017-05-28 DIAGNOSIS — F332 Major depressive disorder, recurrent severe without psychotic features: Secondary | ICD-10-CM | POA: Diagnosis not present

## 2017-06-05 ENCOUNTER — Other Ambulatory Visit: Payer: Self-pay

## 2017-06-05 ENCOUNTER — Encounter: Payer: Self-pay | Admitting: Internal Medicine

## 2017-06-05 ENCOUNTER — Ambulatory Visit (AMBULATORY_SURGERY_CENTER): Payer: PPO | Admitting: Internal Medicine

## 2017-06-05 VITALS — BP 140/94 | HR 82 | Temp 97.3°F | Resp 19 | Ht 69.0 in | Wt 197.0 lb

## 2017-06-05 DIAGNOSIS — M797 Fibromyalgia: Secondary | ICD-10-CM | POA: Diagnosis not present

## 2017-06-05 DIAGNOSIS — K222 Esophageal obstruction: Secondary | ICD-10-CM | POA: Diagnosis not present

## 2017-06-05 DIAGNOSIS — K449 Diaphragmatic hernia without obstruction or gangrene: Secondary | ICD-10-CM | POA: Diagnosis not present

## 2017-06-05 DIAGNOSIS — E669 Obesity, unspecified: Secondary | ICD-10-CM | POA: Diagnosis not present

## 2017-06-05 DIAGNOSIS — F329 Major depressive disorder, single episode, unspecified: Secondary | ICD-10-CM | POA: Diagnosis not present

## 2017-06-05 DIAGNOSIS — K219 Gastro-esophageal reflux disease without esophagitis: Secondary | ICD-10-CM | POA: Diagnosis not present

## 2017-06-05 DIAGNOSIS — R1310 Dysphagia, unspecified: Secondary | ICD-10-CM | POA: Diagnosis not present

## 2017-06-05 DIAGNOSIS — I7300 Raynaud's syndrome without gangrene: Secondary | ICD-10-CM | POA: Diagnosis not present

## 2017-06-05 MED ORDER — SODIUM CHLORIDE 0.9 % IV SOLN: 500.0000 mL | Freq: Once | INTRAVENOUS | Status: DC

## 2017-06-05 NOTE — Patient Instructions (Signed)
Post dilation diet given  YOU HAD AN ENDOSCOPIC PROCEDURE TODAY AT THE [facility]:   Refer to the procedure report that was given to you for any specific questions about what was found during the examination.  If the procedure report does not answer your questions, please call your gastroenterologist to clarify.  If you requested that your care partner not be given the details of your procedure findings, then the procedure report has been included in a sealed envelope for you to review at your convenience later.  YOU SHOULD EXPECT: Some feelings of bloating in the abdomen. Passage of more gas than usual.  Walking can help get rid of the air that was put into your GI tract during the procedure and reduce the bloating. If you had a lower endoscopy (such as a colonoscopy or flexible sigmoidoscopy) you may notice spotting of blood in your stool or on the toilet paper. If you underwent a bowel prep for your procedure, you may not have a normal bowel movement for a few days.  Please Note:  You might notice some irritation and congestion in your nose or some drainage.  This is from the oxygen used during your procedure.  There is no need for concern and it should clear up in a day or so.  SYMPTOMS TO REPORT IMMEDIATELY:     Following upper endoscopy (EGD)  Vomiting of blood or coffee ground material  New chest pain or pain under the shoulder blades  Painful or persistently difficult swallowing  New shortness of breath  Fever of 100F or higher  Black, tarry-looking stools  For urgent or emergent issues, a gastroenterologist can be reached at any hour by calling [phone].   DIET:  We do recommend a small meal at first, but then you may proceed to your regular diet.  Drink plenty of fluids but you should avoid alcoholic beverages for 24 hours.  ACTIVITY:  You should plan to take it easy for the rest of today and you should NOT DRIVE or use heavy machinery until tomorrow (because  of the sedation medicines used during the test).    FOLLOW UP: Our staff will call the number listed on your records the next business day following your procedure to check on you and address any questions or concerns that you may have regarding the information given to you following your procedure. If we do not reach you, we will leave a message.  However, if you are feeling well and you are not experiencing any problems, there is no need to return our call.  We will assume that you have returned to your regular daily activities without incident.  If any biopsies were taken you will be contacted by phone or by letter within the next 1-3 weeks.  Please call us at [phone] if you have not heard about the biopsies in 3 weeks.    SIGNATURES/CONFIDENTIALITY: You and/or your care partner have signed paperwork which will be entered into your electronic medical record.  These signatures attest to the fact that that the information above on your After Visit Summary has been reviewed and is understood.  Full responsibility of the confidentiality of this discharge information lies with you and/or your care-partner. 

## 2017-06-05 NOTE — Progress Notes (Signed)
Called to room to assist during endoscopic procedure.  Patient ID and intended procedure confirmed with present staff. Received instructions for my participation in the procedure from the performing physician.  

## 2017-06-05 NOTE — Progress Notes (Signed)
Report to PACU, RN, vss, BBS= Clear.  

## 2017-06-05 NOTE — Op Note (Signed)
[facility] Patient Name: Alexander Lambert Procedure Date: 06/05/2017 1:43 PM MRN: [id number] Endoscopist: Docia Chuck. Henrene Pastor , MD Age: 72 Referring MD:  Date of Birth: 1945/09/16 Gender: Male Account #: [id number] Procedure:                Upper GI endoscopy, with balloon dilation of the                            esophagus 18?"20 millimeter Indications:              Dysphagia, Vomiting Medicines:                Monitored Anesthesia Care Procedure:                Pre-Anesthesia Assessment:                           - Prior to the procedure, a History and Physical                            was performed, and patient medications and                            allergies were reviewed. The patient's tolerance of                            previous anesthesia was also reviewed. The risks                            and benefits of the procedure and the sedation                            options and risks were discussed with the patient.                            All questions were answered, and informed consent                            was obtained. Prior Anticoagulants: The patient has                            taken no previous anticoagulant or antiplatelet                            agents. ASA Grade Assessment: II - A patient with                            mild systemic disease. After reviewing the risks                            and benefits, the patient was deemed in                            satisfactory condition to undergo the procedure.  After obtaining informed consent, the endoscope was                            passed under direct vision. Throughout the                            procedure, the patient's blood pressure, pulse, and                            oxygen saturations were monitored continuously. The                            Model GIF-HQ190 [id number]) scope was introduced                            through the mouth, and advanced  to the second part                            of duodenum. The upper GI endoscopy was                            accomplished without difficulty. The patient                            tolerated the procedure well. Scope In: Scope Out: Findings:                 The esophagus was was tortuous and foreshortened.                            There was a ringlike stricture measuring 15 mm at                            the gastroesophageal junctionl. A TTS dilator was                            passed through the scope. Dilation with an 18-19-20                            mm balloon dilator was performed to 20 mm. Minor                            therapeutic trauma noted.                           The stomach revealed a large hiatal hernia, 10                            cm,with multiple Cameron erosions. May be                            obstructing. Stomach below was angulated normal.                           The  examined duodenum was normal.                           The cardia and gastric fundus revealed the hiatal                            hernia on retroflexion. Complications:            No immediate complications. Estimated Blood Loss:     Estimated blood loss: none. Impression:               1. Esophageal stricture status post dilation                           2. Large hiatal hernia with Lysbeth Galas erosions,                            possibly causing obstruction                           3. Otherwise normal EGD. Recommendation:           1. Post-dilation diet                           2. Continue PPI                           3. If symptoms persist despite large-caliber                            dilation then proceed with upper GI series to                            evaluate anatomy and rule out obstructing large                            hiatal hernia Javan N. Henrene Pastor, MD 06/05/2017 2:26:08 PM This report has been signed electronically.

## 2017-06-08 ENCOUNTER — Telehealth: Payer: Self-pay

## 2017-06-08 DIAGNOSIS — F332 Major depressive disorder, recurrent severe without psychotic features: Secondary | ICD-10-CM | POA: Diagnosis not present

## 2017-06-08 NOTE — Telephone Encounter (Signed)
  Follow up Call-  Call back number 06/05/2017 09/24/2015  Post procedure Call Back phone  # [phone]  Permission to leave phone message Yes Yes  Some recent data might be hidden     Patient questions:  Do you have a fever, pain , or abdominal swelling? No. Pain Score  0 *  Have you tolerated food without any problems? Yes.    Have you been able to return to your normal activities? Yes.    Do you have any questions about your discharge instructions: Diet   No. Medications  No. Follow up visit  No.  Do you have questions or concerns about your Care? No.  Actions: * If pain score is 4 or above: No action needed, pain <4.  Pt was still asleep.  I spoke with Alexander Lambert.  She reported he was back to normal.  No problems noted. maw

## 2017-06-15 ENCOUNTER — Other Ambulatory Visit: Payer: Self-pay | Admitting: Internal Medicine

## 2017-06-17 DIAGNOSIS — H02831 Dermatochalasis of right upper eyelid: Secondary | ICD-10-CM | POA: Diagnosis not present

## 2017-06-17 DIAGNOSIS — H2513 Age-related nuclear cataract, bilateral: Secondary | ICD-10-CM | POA: Diagnosis not present

## 2017-06-17 DIAGNOSIS — H02834 Dermatochalasis of left upper eyelid: Secondary | ICD-10-CM | POA: Diagnosis not present

## 2017-06-17 DIAGNOSIS — H57813 Brow ptosis, bilateral: Secondary | ICD-10-CM | POA: Diagnosis not present

## 2017-06-17 DIAGNOSIS — H31091 Other chorioretinal scars, right eye: Secondary | ICD-10-CM | POA: Diagnosis not present

## 2017-06-17 DIAGNOSIS — H40013 Open angle with borderline findings, low risk, bilateral: Secondary | ICD-10-CM | POA: Diagnosis not present

## 2017-06-19 ENCOUNTER — Ambulatory Visit: Payer: PPO | Admitting: Internal Medicine

## 2017-06-22 ENCOUNTER — Telehealth: Payer: Self-pay | Admitting: Internal Medicine

## 2017-06-22 ENCOUNTER — Encounter: Payer: PPO | Admitting: Internal Medicine

## 2017-06-22 ENCOUNTER — Other Ambulatory Visit: Payer: Self-pay

## 2017-06-22 DIAGNOSIS — R1310 Dysphagia, unspecified: Secondary | ICD-10-CM

## 2017-06-22 NOTE — Telephone Encounter (Signed)
Pt reports he is unable to eat and he is now feeling weak. States he wants to proceed to the "next step" as he is not swallowing any better. Per EGD report it mentions Upper GI series. Dr. Henrene Pastor please advise.

## 2017-06-22 NOTE — Telephone Encounter (Signed)
Upper GI series "large hiatal hernia, rule out partial obstruction"

## 2017-06-22 NOTE — Telephone Encounter (Signed)
Pt scheduled for Upper GI series at [facility] 07/03/17@9 :30am, pt to arrive there at 9:15am. Pt to be NPO after midnight. Pt is able to drink liquids and can eat soups, puddings and yogurt. Pt aware of appt.

## 2017-07-03 ENCOUNTER — Ambulatory Visit (HOSPITAL_COMMUNITY)
Admission: RE | Admit: 2017-07-03 | Discharge: 2017-07-03 | Disposition: A | Discharge status: Home / Self Care | Payer: PPO | Source: Ambulatory Visit | Attending: Internal Medicine | Admitting: Internal Medicine

## 2017-07-03 DIAGNOSIS — K449 Diaphragmatic hernia without obstruction or gangrene: Secondary | ICD-10-CM | POA: Diagnosis not present

## 2017-07-03 DIAGNOSIS — R1310 Dysphagia, unspecified: Secondary | ICD-10-CM

## 2017-07-03 DIAGNOSIS — K224 Dyskinesia of esophagus: Secondary | ICD-10-CM | POA: Diagnosis not present

## 2017-07-03 DIAGNOSIS — K3189 Other diseases of stomach and duodenum: Secondary | ICD-10-CM | POA: Diagnosis not present

## 2017-07-08 DIAGNOSIS — K449 Diaphragmatic hernia without obstruction or gangrene: Secondary | ICD-10-CM | POA: Diagnosis not present

## 2017-07-13 ENCOUNTER — Ambulatory Visit: Payer: Self-pay | Admitting: General Surgery

## 2017-07-13 NOTE — Patient Instructions (Signed)
ASAPH SERENA  07/13/2017   Your procedure is scheduled on: 07/22/2017   Report to [facility] Main  Entrance  Report to admitting at   [place] AM   Call this number if you have problems the morning of surgery [phone]   Remember: Do not eat food or drink liquids :After Midnight.     Take these medicines the morning of surgery with A SIP OF WATER: Wellbutrin, Cymbalta, Prilosec                                 You may not have any metal on your body including hair pins and              piercings  Do not wear jewelry,  lotions, powders or perfumes, deodorant                          Men may shave face and neck.   Do not bring valuables to the hospital. [place].  Contacts, dentures or bridgework may not be worn into surgery.  Leave suitcase in the car. After surgery it may be brought to your room.                    Please read over the following fact sheets you were given: _____________________________________________________________________             [facility] - Preparing for Surgery Before surgery, you can play an important role.  Because skin is not sterile, your skin needs to be as free of germs as possible.  You can reduce the number of germs on your skin by washing with CHG (chlorahexidine gluconate) soap before surgery.  CHG is an antiseptic cleaner which kills germs and bonds with the skin to continue killing germs even after washing. Please DO NOT use if you have an allergy to CHG or antibacterial soaps.  If your skin becomes reddened/irritated stop using the CHG and inform your nurse when you arrive at Short Stay. Do not shave (including legs and underarms) for at least 48 hours prior to the first CHG shower.  You may shave your face/neck. Please follow these instructions carefully:  1.  Shower with CHG Soap the night before surgery and the  morning of Surgery.  2.  If you choose to wash your  hair, wash your hair first as usual with your  normal  shampoo.  3.  After you shampoo, rinse your hair and body thoroughly to remove the  shampoo.                           4.  Use CHG as you would any other liquid soap.  You can apply chg directly  to the skin and wash                       Gently with a scrungie or clean washcloth.  5.  Apply the CHG Soap to your body ONLY FROM THE NECK DOWN.   Do not use on face/ open  Wound or open sores. Avoid contact with eyes, ears mouth and genitals (private parts).                       Wash face,  Genitals (private parts) with your normal soap.             6.  Wash thoroughly, paying special attention to the area where your surgery  will be performed.  7.  Thoroughly rinse your body with warm water from the neck down.  8.  DO NOT shower/wash with your normal soap after using and rinsing off  the CHG Soap.                9.  Pat yourself dry with a clean towel.            10.  Wear clean pajamas.            11.  Place clean sheets on your bed the night of your first shower and do not  sleep with pets. Day of Surgery : Do not apply any lotions/deodorants the morning of surgery.  Please wear clean clothes to the hospital/surgery center.  FAILURE TO FOLLOW THESE INSTRUCTIONS MAY RESULT IN THE CANCELLATION OF YOUR SURGERY PATIENT SIGNATURE_________________________________  NURSE SIGNATURE__________________________________  ________________________________________________________________________

## 2017-07-13 NOTE — H&P (Signed)
History of Present Illness Alexander Lambert; 07/08/2017 3:09 PM) The patient is a 72 year old male who presents with a hiatal hernia. Referred by:Dr. Scarlette Shorts Chief Complaint: Hiatal hernia Patient is a 72 year old male who has a history of hyperlipidemia, fibromyalgia, however hernia. Patient has had a multiple year history of dysphagia. Patient has had multiple dilations secondary to dysphagia. He's undergone endoscopies most recently on 06/05/2017 which revealed a large hiatal hernia as well as Lysbeth Galas erosions. Patient states that his dysphagia currently is with more solid food. He is able to get soft and liquid foods down. Patient is undergone upper GI which reveals a large hiatal hernia with approximately 50% of this stomach in his chest. This both left and right chest. Patient had a CT scan in 2018 which revealed approximately 3 cm hernia at the hiatus. I did review these scans personally.  Patient has had no previous abdominal surgery.    Past Surgical History Alean Rinne, [place]; 07/08/2017 2:45 PM) Gallbladder Surgery - Laparoscopic  Oral Surgery   Allergies Mardene Celeste [place], [place]; 07/08/2017 2:48 PM) Promethazine HCl *ANTIHISTAMINES*  Allergies Reconciled   Medication History Alean Rinne, RMA; 07/08/2017 2:51 PM) BuPROPion HCl (100MG  Tablet, Oral) Active. Omeprazole (40MG  Capsule DR, Oral) Active. DULoxetine HCl (60MG  Capsule DR Part, Oral) Active. Ibandronate Sodium (150MG  Tablet, Oral) Active. Coenzyme Q-10 (200MG  Capsule, Oral) Active. TraZODone HCl (100MG  Tablet, Oral) Active. Vitamin B-12 (1000MCG Tablet, Oral) Active. Zolpidem Tartrate (5MG  Tablet, Oral) Active. Ergocalciferol (50000UNIT Capsule, Oral) Active. Medications Reconciled  Social History Alean Rinne, [place]; 07/08/2017 2:45 PM) Alcohol use  Moderate alcohol use. Caffeine use  Coffee. Tobacco use  Former smoker.  Family History Alean Rinne, [place]; 07/08/2017 2:45 PM) Heart  Disease  Father, Mother. Respiratory Condition  Mother.  Other Problems Alean Rinne, [place]; 07/08/2017 2:45 PM) Anxiety Disorder  Back Pain  Cholelithiasis  Depression  Gastroesophageal Reflux Disease  Hypercholesterolemia     Review of Systems Alexander Lambert; 07/08/2017 3:06 PM) General Present- Weight Loss. Not Present- Appetite Loss, Chills, Fatigue, Fever, Night Sweats and Weight Gain. Skin Not Present- Change in Wart/Mole, Dryness, Hives, Jaundice, New Lesions, Non-Healing Wounds, Rash and Ulcer. HEENT Present- Hearing Loss and Wears glasses/contact lenses. Not Present- Earache, Hoarseness, Nose Bleed, Oral Ulcers, Ringing in the Ears, Seasonal Allergies, Sinus Pain, Sore Throat, Visual Disturbances and Yellow Eyes. Respiratory Not Present- Bloody sputum, Chronic Cough, Difficulty Breathing, Snoring and Wheezing. Breast Not Present- Breast Mass, Breast Pain, Nipple Discharge and Skin Changes. Cardiovascular Not Present- Chest Pain, Difficulty Breathing Lying Down, Leg Cramps, Palpitations, Rapid Heart Rate, Shortness of Breath and Swelling of Extremities. Gastrointestinal Present- Change in Bowel Habits, Difficulty Swallowing, Excessive gas, Indigestion and Vomiting. Not Present- Abdominal Pain, Bloating, Bloody Stool, Chronic diarrhea, Constipation, Gets full quickly at meals, Hemorrhoids, Nausea and Rectal Pain. Male Genitourinary Not Present- Blood in Urine, Change in Urinary Stream, Frequency, Impotence, Nocturia, Painful Urination, Urgency and Urine Leakage. Musculoskeletal Present- Muscle Pain and Muscle Weakness. Not Present- Back Pain, Joint Pain, Joint Stiffness and Swelling of Extremities. Neurological Present- Decreased Memory and Trouble walking. Not Present- Fainting, Headaches, Numbness, Seizures, Tingling, Tremor and Weakness. Psychiatric Present- Anxiety and Depression. Not Present- Bipolar, Change in Sleep Pattern, Fearful and Frequent crying. Endocrine Not  Present- Cold Intolerance, Excessive Hunger, Hair Changes, Heat Intolerance and New Diabetes. Hematology Not Present- Blood Thinners, Easy Bruising, Excessive bleeding, Gland problems, HIV and Persistent Infections. All other systems negative  Vitals Alean Rinne RMA; 07/08/2017 2:46 PM) 07/08/2017 2:46 PM Weight: 194.2  lb Height: 70in Body Surface Area: 2.06 m Body Mass Index: 27.86 kg/m  Temp.: 98.23F  BP: 135/82 (Sitting, Left Arm, Standard)       Physical Exam Alexander Lambert; 07/08/2017 3:09 PM) The physical exam findings are as follows: Note:Constitutional: No acute distress, conversant, appears stated age  Eyes: Anicteric sclerae, moist conjunctiva, no lid lag  Neck: No thyromegaly, trachea midline, no cervical lymphadenopathy  Lungs: Clear to auscultation biilaterally, normal respiratory effot  Cardiovascular: regular rate & rhythm, no murmurs, no peripheal edema, pedal pulses 2+  GI: Soft, no masses or hepatosplenomegaly, non-tender to palpation  MSK: Normal gait, no clubbing cyanosis, edema  Skin: No rashes, palpation reveals normal skin turgor  Psychiatric: Appropriate judgment and insight, oriented to person, place, and time    Assessment & Plan Alexander Lambert; 07/08/2017 3:10 PM) HIATAL HERNIA (K44.9) Impression: 72 year old male with a large hiatal hernia  1. Will proceed to the operating room for a robotic hiatal hernia repair and Nissen fundoplication. 2. I discussed with him the risks and benefits of the procedure to include but not limited to: Infection, bleeding, damage to structures, possible pneumothorax, possible need for further surgery. The patient voiced understanding and wishes to proceed.

## 2017-07-13 NOTE — H&P (View-Only) (Signed)
History of Present Illness Ralene Ok MD; 07/08/2017 3:09 PM) The patient is a 72 year old male who presents with a hiatal hernia. Referred by:Dr. Scarlette Shorts Chief Complaint: Hiatal hernia Patient is a 72 year old male who has a history of hyperlipidemia, fibromyalgia, however hernia. Patient has had a multiple year history of dysphagia. Patient has had multiple dilations secondary to dysphagia. He's undergone endoscopies most recently on 06/05/2017 which revealed a large hiatal hernia as well as Lysbeth Galas erosions. Patient states that his dysphagia currently is with more solid food. He is able to get soft and liquid foods down. Patient is undergone upper GI which reveals a large hiatal hernia with approximately 50% of this stomach in his chest. This both left and right chest. Patient had a CT scan in 2018 which revealed approximately 3 cm hernia at the hiatus. I did review these scans personally.  Patient has had no previous abdominal surgery.    Past Surgical History Alean Rinne, [place]; 07/08/2017 2:45 PM) Gallbladder Surgery - Laparoscopic  Oral Surgery   Allergies Mardene Celeste [place], [place]; 07/08/2017 2:48 PM) Promethazine HCl *ANTIHISTAMINES*  Allergies Reconciled   Medication History Alean Rinne, RMA; 07/08/2017 2:51 PM) BuPROPion HCl (100MG  Tablet, Oral) Active. Omeprazole (40MG  Capsule DR, Oral) Active. DULoxetine HCl (60MG  Capsule DR Part, Oral) Active. Ibandronate Sodium (150MG  Tablet, Oral) Active. Coenzyme Q-10 (200MG  Capsule, Oral) Active. TraZODone HCl (100MG  Tablet, Oral) Active. Vitamin B-12 (1000MCG Tablet, Oral) Active. Zolpidem Tartrate (5MG  Tablet, Oral) Active. Ergocalciferol (50000UNIT Capsule, Oral) Active. Medications Reconciled  Social History Alean Rinne, [place]; 07/08/2017 2:45 PM) Alcohol use  Moderate alcohol use. Caffeine use  Coffee. Tobacco use  Former smoker.  Family History Alean Rinne, [place]; 07/08/2017 2:45 PM) Heart  Disease  Father, Mother. Respiratory Condition  Mother.  Other Problems Alean Rinne, [place]; 07/08/2017 2:45 PM) Anxiety Disorder  Back Pain  Cholelithiasis  Depression  Gastroesophageal Reflux Disease  Hypercholesterolemia     Review of Systems Ralene Ok MD; 07/08/2017 3:06 PM) General Present- Weight Loss. Not Present- Appetite Loss, Chills, Fatigue, Fever, Night Sweats and Weight Gain. Skin Not Present- Change in Wart/Mole, Dryness, Hives, Jaundice, New Lesions, Non-Healing Wounds, Rash and Ulcer. HEENT Present- Hearing Loss and Wears glasses/contact lenses. Not Present- Earache, Hoarseness, Nose Bleed, Oral Ulcers, Ringing in the Ears, Seasonal Allergies, Sinus Pain, Sore Throat, Visual Disturbances and Yellow Eyes. Respiratory Not Present- Bloody sputum, Chronic Cough, Difficulty Breathing, Snoring and Wheezing. Breast Not Present- Breast Mass, Breast Pain, Nipple Discharge and Skin Changes. Cardiovascular Not Present- Chest Pain, Difficulty Breathing Lying Down, Leg Cramps, Palpitations, Rapid Heart Rate, Shortness of Breath and Swelling of Extremities. Gastrointestinal Present- Change in Bowel Habits, Difficulty Swallowing, Excessive gas, Indigestion and Vomiting. Not Present- Abdominal Pain, Bloating, Bloody Stool, Chronic diarrhea, Constipation, Gets full quickly at meals, Hemorrhoids, Nausea and Rectal Pain. Male Genitourinary Not Present- Blood in Urine, Change in Urinary Stream, Frequency, Impotence, Nocturia, Painful Urination, Urgency and Urine Leakage. Musculoskeletal Present- Muscle Pain and Muscle Weakness. Not Present- Back Pain, Joint Pain, Joint Stiffness and Swelling of Extremities. Neurological Present- Decreased Memory and Trouble walking. Not Present- Fainting, Headaches, Numbness, Seizures, Tingling, Tremor and Weakness. Psychiatric Present- Anxiety and Depression. Not Present- Bipolar, Change in Sleep Pattern, Fearful and Frequent crying. Endocrine Not  Present- Cold Intolerance, Excessive Hunger, Hair Changes, Heat Intolerance and New Diabetes. Hematology Not Present- Blood Thinners, Easy Bruising, Excessive bleeding, Gland problems, HIV and Persistent Infections. All other systems negative  Vitals Alean Rinne RMA; 07/08/2017 2:46 PM) 07/08/2017 2:46 PM Weight: 194.2  lb Height: 70in Body Surface Area: 2.06 m Body Mass Index: 27.86 kg/m  Temp.: 98.19F  BP: 135/82 (Sitting, Left Arm, Standard)       Physical Exam Ralene Ok MD; 07/08/2017 3:09 PM) The physical exam findings are as follows: Note:Constitutional: No acute distress, conversant, appears stated age  Eyes: Anicteric sclerae, moist conjunctiva, no lid lag  Neck: No thyromegaly, trachea midline, no cervical lymphadenopathy  Lungs: Clear to auscultation biilaterally, normal respiratory effot  Cardiovascular: regular rate & rhythm, no murmurs, no peripheal edema, pedal pulses 2+  GI: Soft, no masses or hepatosplenomegaly, non-tender to palpation  MSK: Normal gait, no clubbing cyanosis, edema  Skin: No rashes, palpation reveals normal skin turgor  Psychiatric: Appropriate judgment and insight, oriented to person, place, and time    Assessment & Plan Ralene Ok MD; 07/08/2017 3:10 PM) HIATAL HERNIA (K44.9) Impression: 72 year old male with a large hiatal hernia  1. Will proceed to the operating room for a robotic hiatal hernia repair and Nissen fundoplication. 2. I discussed with him the risks and benefits of the procedure to include but not limited to: Infection, bleeding, damage to structures, possible pneumothorax, possible need for further surgery. The patient voiced understanding and wishes to proceed.

## 2017-07-16 ENCOUNTER — Encounter (HOSPITAL_COMMUNITY): Payer: Self-pay

## 2017-07-16 ENCOUNTER — Other Ambulatory Visit: Payer: Self-pay

## 2017-07-16 ENCOUNTER — Encounter (HOSPITAL_COMMUNITY)
Admission: RE | Admit: 2017-07-16 | Discharge: 2017-07-16 | Disposition: A | Discharge status: Home / Self Care | Payer: PPO | Source: Ambulatory Visit | Attending: General Surgery | Admitting: General Surgery

## 2017-07-16 DIAGNOSIS — Z01812 Encounter for preprocedural laboratory examination: Secondary | ICD-10-CM | POA: Diagnosis not present

## 2017-07-16 HISTORY — DX: Anemia, unspecified: D64.9

## 2017-07-16 HISTORY — DX: Cardiac murmur, unspecified: R01.1

## 2017-07-16 HISTORY — DX: Family history of other specified conditions: Z84.89

## 2017-07-16 LAB — CBC
HCT: 44.2 % (ref 39.0–52.0)
HEMOGLOBIN: 14.6 g/dL (ref 13.0–17.0)
MCH: 29.4 pg (ref 26.0–34.0)
MCHC: 33 g/dL (ref 30.0–36.0)
MCV: 88.9 fL (ref 78.0–100.0)
Platelets: 253 10*3/uL (ref 150–400)
RBC: 4.97 MIL/uL (ref 4.22–5.81)
RDW: 12.6 % (ref 11.5–15.5)
WBC: 8 10*3/uL (ref 4.0–10.5)

## 2017-07-16 NOTE — Progress Notes (Signed)
EKG-02/13/2017-epic

## 2017-07-22 ENCOUNTER — Inpatient Hospital Stay (HOSPITAL_COMMUNITY): Payer: PPO | Admitting: Anesthesiology

## 2017-07-22 ENCOUNTER — Inpatient Hospital Stay (HOSPITAL_COMMUNITY)
Admission: RE | Admit: 2017-07-22 | Discharge: 2017-07-23 | DRG: 328 | Disposition: A | Discharge status: Home / Self Care | Payer: PPO | Source: Ambulatory Visit | Attending: General Surgery | Admitting: General Surgery

## 2017-07-22 ENCOUNTER — Encounter (HOSPITAL_COMMUNITY): Payer: Self-pay

## 2017-07-22 ENCOUNTER — Encounter (HOSPITAL_COMMUNITY)
Admission: RE | Disposition: A | Discharge status: Home / Self Care | Payer: Self-pay | Source: Ambulatory Visit | Attending: General Surgery

## 2017-07-22 DIAGNOSIS — Z9889 Other specified postprocedural states: Secondary | ICD-10-CM | POA: Diagnosis not present

## 2017-07-22 DIAGNOSIS — K222 Esophageal obstruction: Secondary | ICD-10-CM | POA: Diagnosis not present

## 2017-07-22 DIAGNOSIS — I739 Peripheral vascular disease, unspecified: Secondary | ICD-10-CM | POA: Diagnosis not present

## 2017-07-22 DIAGNOSIS — R1310 Dysphagia, unspecified: Secondary | ICD-10-CM | POA: Diagnosis not present

## 2017-07-22 DIAGNOSIS — F419 Anxiety disorder, unspecified: Secondary | ICD-10-CM | POA: Diagnosis present

## 2017-07-22 DIAGNOSIS — E7800 Pure hypercholesterolemia, unspecified: Secondary | ICD-10-CM | POA: Diagnosis not present

## 2017-07-22 DIAGNOSIS — K259 Gastric ulcer, unspecified as acute or chronic, without hemorrhage or perforation: Secondary | ICD-10-CM | POA: Diagnosis present

## 2017-07-22 DIAGNOSIS — F329 Major depressive disorder, single episode, unspecified: Secondary | ICD-10-CM | POA: Diagnosis present

## 2017-07-22 DIAGNOSIS — E785 Hyperlipidemia, unspecified: Secondary | ICD-10-CM | POA: Diagnosis present

## 2017-07-22 DIAGNOSIS — K219 Gastro-esophageal reflux disease without esophagitis: Secondary | ICD-10-CM | POA: Diagnosis not present

## 2017-07-22 DIAGNOSIS — M797 Fibromyalgia: Secondary | ICD-10-CM | POA: Diagnosis present

## 2017-07-22 DIAGNOSIS — Z87891 Personal history of nicotine dependence: Secondary | ICD-10-CM

## 2017-07-22 DIAGNOSIS — Z888 Allergy status to other drugs, medicaments and biological substances status: Secondary | ICD-10-CM

## 2017-07-22 DIAGNOSIS — K449 Diaphragmatic hernia without obstruction or gangrene: Secondary | ICD-10-CM | POA: Diagnosis not present

## 2017-07-22 HISTORY — PX: INSERTION OF MESH: SHX5868

## 2017-07-22 HISTORY — PX: HIATAL HERNIA REPAIR: SHX195

## 2017-07-22 SURGERY — REPAIR, HERNIA, HIATAL, LAPAROSCOPIC
Anesthesia: General | Site: Abdomen

## 2017-07-22 MED ORDER — PHENYLEPHRINE HCL 10 MG/ML IJ SOLN
INTRAMUSCULAR | Status: DC | PRN
  Administered 2017-07-22: 15 ug/min via INTRAVENOUS

## 2017-07-22 MED ORDER — FENTANYL CITRATE (PF) 100 MCG/2ML IJ SOLN: 25.0000 ug | INTRAMUSCULAR | Status: DC | PRN

## 2017-07-22 MED ORDER — DEXTROSE-NACL 5-0.9 % IV SOLN
INTRAVENOUS | Status: DC
  Administered 2017-07-22: 18:00:00 via INTRAVENOUS
  Administered 2017-07-23: 1000 mL via INTRAVENOUS

## 2017-07-22 MED ORDER — GABAPENTIN 300 MG PO CAPS
300.0000 mg | ORAL_CAPSULE | ORAL | Status: AC
  Administered 2017-07-22: 300 mg via ORAL
  Filled 2017-07-22: qty 1

## 2017-07-22 MED ORDER — DEXAMETHASONE SODIUM PHOSPHATE 10 MG/ML IJ SOLN
INTRAMUSCULAR | Status: AC
  Filled 2017-07-22: qty 1

## 2017-07-22 MED ORDER — KETOROLAC TROMETHAMINE 30 MG/ML IJ SOLN: 30.0000 mg | Freq: Four times a day (QID) | INTRAMUSCULAR | Status: DC | PRN

## 2017-07-22 MED ORDER — LABETALOL HCL 5 MG/ML IV SOLN
INTRAVENOUS | Status: AC
  Filled 2017-07-22: qty 4

## 2017-07-22 MED ORDER — BUPIVACAINE-EPINEPHRINE (PF) 0.5% -1:200000 IJ SOLN
INTRAMUSCULAR | Status: DC | PRN
  Administered 2017-07-22: 25 mL

## 2017-07-22 MED ORDER — ONDANSETRON HCL 4 MG/2ML IJ SOLN
INTRAMUSCULAR | Status: DC | PRN
  Administered 2017-07-22: 4 mg via INTRAVENOUS

## 2017-07-22 MED ORDER — BUPIVACAINE-EPINEPHRINE (PF) 0.5% -1:200000 IJ SOLN
INTRAMUSCULAR | Status: AC
  Filled 2017-07-22: qty 30

## 2017-07-22 MED ORDER — SUGAMMADEX SODIUM 200 MG/2ML IV SOLN
INTRAVENOUS | Status: AC
  Filled 2017-07-22: qty 2

## 2017-07-22 MED ORDER — KETOROLAC TROMETHAMINE 30 MG/ML IJ SOLN
30.0000 mg | Freq: Four times a day (QID) | INTRAMUSCULAR | Status: DC
  Administered 2017-07-22 – 2017-07-23 (×4): 30 mg via INTRAVENOUS
  Filled 2017-07-22 (×4): qty 1

## 2017-07-22 MED ORDER — ONDANSETRON HCL 4 MG/2ML IJ SOLN: 4.0000 mg | Freq: Four times a day (QID) | INTRAMUSCULAR | Status: DC | PRN

## 2017-07-22 MED ORDER — SUFENTANIL CITRATE 50 MCG/ML IV SOLN
INTRAVENOUS | Status: DC | PRN
  Administered 2017-07-22 (×4): 5 ug via INTRAVENOUS
  Administered 2017-07-22 (×2): 10 ug via INTRAVENOUS
  Administered 2017-07-22: 5 ug via INTRAVENOUS
  Administered 2017-07-22: 10 ug via INTRAVENOUS

## 2017-07-22 MED ORDER — LACTATED RINGERS IR SOLN
Status: DC | PRN
  Administered 2017-07-22: 1000 mL

## 2017-07-22 MED ORDER — ACETAMINOPHEN 10 MG/ML IV SOLN
INTRAVENOUS | Status: AC
  Filled 2017-07-22: qty 100

## 2017-07-22 MED ORDER — HYDROMORPHONE HCL 1 MG/ML IJ SOLN
INTRAMUSCULAR | Status: DC | PRN
  Administered 2017-07-22: 0.5 mg via INTRAVENOUS
  Administered 2017-07-22: .5 mg via INTRAVENOUS

## 2017-07-22 MED ORDER — ROCURONIUM BROMIDE 10 MG/ML (PF) SYRINGE
PREFILLED_SYRINGE | INTRAVENOUS | Status: AC
  Filled 2017-07-22: qty 5

## 2017-07-22 MED ORDER — MIDAZOLAM HCL 2 MG/2ML IJ SOLN
INTRAMUSCULAR | Status: AC
  Filled 2017-07-22: qty 2

## 2017-07-22 MED ORDER — MIDAZOLAM HCL 5 MG/5ML IJ SOLN
INTRAMUSCULAR | Status: DC | PRN
  Administered 2017-07-22: 1 mg via INTRAVENOUS

## 2017-07-22 MED ORDER — PROPOFOL 10 MG/ML IV BOLUS
INTRAVENOUS | Status: DC | PRN
  Administered 2017-07-22: 170 mg via INTRAVENOUS

## 2017-07-22 MED ORDER — PROPOFOL 10 MG/ML IV BOLUS
INTRAVENOUS | Status: AC
  Filled 2017-07-22: qty 40

## 2017-07-22 MED ORDER — ONDANSETRON HCL 4 MG/2ML IJ SOLN
INTRAMUSCULAR | Status: AC
  Filled 2017-07-22: qty 2

## 2017-07-22 MED ORDER — PHENYLEPHRINE 40 MCG/ML (10ML) SYRINGE FOR IV PUSH (FOR BLOOD PRESSURE SUPPORT)
PREFILLED_SYRINGE | INTRAVENOUS | Status: AC
  Filled 2017-07-22: qty 10

## 2017-07-22 MED ORDER — SUFENTANIL CITRATE 50 MCG/ML IV SOLN
INTRAVENOUS | Status: AC
  Filled 2017-07-22: qty 1

## 2017-07-22 MED ORDER — CHLORHEXIDINE GLUCONATE CLOTH 2 % EX PADS: 6.0000 | MEDICATED_PAD | Freq: Once | CUTANEOUS | Status: DC

## 2017-07-22 MED ORDER — ENOXAPARIN SODIUM 40 MG/0.4ML ~~LOC~~ SOLN
40.0000 mg | SUBCUTANEOUS | Status: DC
  Administered 2017-07-23: 40 mg via SUBCUTANEOUS
  Filled 2017-07-22: qty 0.4

## 2017-07-22 MED ORDER — HYDRALAZINE HCL 20 MG/ML IJ SOLN: 10.0000 mg | INTRAMUSCULAR | Status: DC | PRN

## 2017-07-22 MED ORDER — LIP MEDEX EX OINT
TOPICAL_OINTMENT | CUTANEOUS | Status: AC
  Filled 2017-07-22: qty 7

## 2017-07-22 MED ORDER — SUCCINYLCHOLINE CHLORIDE 200 MG/10ML IV SOSY
PREFILLED_SYRINGE | INTRAVENOUS | Status: DC | PRN
  Administered 2017-07-22: 140 mg via INTRAVENOUS

## 2017-07-22 MED ORDER — ONDANSETRON 4 MG PO TBDP: 4.0000 mg | ORAL_TABLET | Freq: Four times a day (QID) | ORAL | Status: DC | PRN

## 2017-07-22 MED ORDER — CELECOXIB 200 MG PO CAPS
200.0000 mg | ORAL_CAPSULE | ORAL | Status: AC
  Administered 2017-07-22: 200 mg via ORAL
  Filled 2017-07-22: qty 1

## 2017-07-22 MED ORDER — SUCCINYLCHOLINE CHLORIDE 200 MG/10ML IV SOSY
PREFILLED_SYRINGE | INTRAVENOUS | Status: AC
  Filled 2017-07-22: qty 10

## 2017-07-22 MED ORDER — SODIUM CHLORIDE 0.9 % IJ SOLN
INTRAMUSCULAR | Status: AC
  Filled 2017-07-22: qty 10

## 2017-07-22 MED ORDER — LABETALOL HCL 5 MG/ML IV SOLN
INTRAVENOUS | Status: DC | PRN
  Administered 2017-07-22: 2.5 mg via INTRAVENOUS

## 2017-07-22 MED ORDER — 0.9 % SODIUM CHLORIDE (POUR BTL) OPTIME
TOPICAL | Status: DC | PRN
  Administered 2017-07-22: 1000 mL

## 2017-07-22 MED ORDER — SUGAMMADEX SODIUM 200 MG/2ML IV SOLN
INTRAVENOUS | Status: DC | PRN
  Administered 2017-07-22: 200 mg via INTRAVENOUS

## 2017-07-22 MED ORDER — LACTATED RINGERS IV SOLN
INTRAVENOUS | Status: DC
  Administered 2017-07-22: 1000 mL via INTRAVENOUS
  Administered 2017-07-22 (×3): via INTRAVENOUS

## 2017-07-22 MED ORDER — ONDANSETRON HCL 4 MG/2ML IJ SOLN: 4.0000 mg | Freq: Once | INTRAMUSCULAR | Status: DC | PRN

## 2017-07-22 MED ORDER — DEXAMETHASONE SODIUM PHOSPHATE 10 MG/ML IJ SOLN
INTRAMUSCULAR | Status: DC | PRN
  Administered 2017-07-22: 10 mg via INTRAVENOUS

## 2017-07-22 MED ORDER — HYDROMORPHONE HCL 2 MG/ML IJ SOLN
INTRAMUSCULAR | Status: AC
  Filled 2017-07-22: qty 1

## 2017-07-22 MED ORDER — ROCURONIUM BROMIDE 10 MG/ML (PF) SYRINGE
PREFILLED_SYRINGE | INTRAVENOUS | Status: DC | PRN
  Administered 2017-07-22: 10 mg via INTRAVENOUS
  Administered 2017-07-22: 60 mg via INTRAVENOUS
  Administered 2017-07-22: 10 mg via INTRAVENOUS

## 2017-07-22 MED ORDER — CEFAZOLIN SODIUM-DEXTROSE 2-4 GM/100ML-% IV SOLN
2.0000 g | INTRAVENOUS | Status: AC
  Administered 2017-07-22: 2 g via INTRAVENOUS
  Filled 2017-07-22: qty 100

## 2017-07-22 MED ORDER — LIDOCAINE 2% (20 MG/ML) 5 ML SYRINGE
INTRAMUSCULAR | Status: DC | PRN
  Administered 2017-07-22: 25 mg via INTRAVENOUS
  Administered 2017-07-22: 75 mg via INTRAVENOUS

## 2017-07-22 MED ORDER — ACETAMINOPHEN 10 MG/ML IV SOLN
INTRAVENOUS | Status: DC | PRN
  Administered 2017-07-22: 1000 mg via INTRAVENOUS

## 2017-07-22 MED ORDER — PHENYLEPHRINE HCL 10 MG/ML IJ SOLN
INTRAMUSCULAR | Status: AC
  Filled 2017-07-22: qty 1

## 2017-07-22 MED ORDER — ACETAMINOPHEN 500 MG PO TABS
1000.0000 mg | ORAL_TABLET | ORAL | Status: AC
  Administered 2017-07-22: 1000 mg via ORAL
  Filled 2017-07-22: qty 2

## 2017-07-22 MED ORDER — HYDROMORPHONE HCL 1 MG/ML IJ SOLN
1.0000 mg | INTRAMUSCULAR | Status: DC | PRN
  Administered 2017-07-23: 1 mg via INTRAVENOUS
  Filled 2017-07-22: qty 1

## 2017-07-22 MED ORDER — LIDOCAINE 2% (20 MG/ML) 5 ML SYRINGE
INTRAMUSCULAR | Status: AC
  Filled 2017-07-22: qty 5

## 2017-07-22 SURGICAL SUPPLY — 51 items
ADH SKN CLS APL DERMABOND .7 (GAUZE/BANDAGES/DRESSINGS) ×1
APPLIER CLIP 5 13 M/L LIGAMAX5 (MISCELLANEOUS)
APPLIER CLIP ROT 10 11.4 M/L (STAPLE)
APR CLP MED LRG 11.4X10 (STAPLE)
APR CLP MED LRG 5 ANG JAW (MISCELLANEOUS)
CHLORAPREP W/TINT 26ML (MISCELLANEOUS) ×3 IMPLANT
CLIP APPLIE 5 13 M/L LIGAMAX5 (MISCELLANEOUS) IMPLANT
CLIP APPLIE ROT 10 11.4 M/L (STAPLE) IMPLANT
DECANTER SPIKE VIAL GLASS SM (MISCELLANEOUS) ×3 IMPLANT
DERMABOND ADVANCED (GAUZE/BANDAGES/DRESSINGS) ×2
DERMABOND ADVANCED .7 DNX12 (GAUZE/BANDAGES/DRESSINGS) IMPLANT
DEVICE TROCAR PUNCTURE CLOSURE (ENDOMECHANICALS) IMPLANT
DRAIN PENROSE 18X1/2 LTX STRL (DRAIN) ×3 IMPLANT
DRAPE UTILITY XL STRL (DRAPES) ×3 IMPLANT
ELECT REM PT RETURN 15FT ADLT (MISCELLANEOUS) ×3 IMPLANT
GLOVE BIO SURGEON STRL SZ7.5 (GLOVE) ×3 IMPLANT
GOWN STRL REUS W/TWL XL LVL3 (GOWN DISPOSABLE) ×12 IMPLANT
KIT BASIN OR (CUSTOM PROCEDURE TRAY) ×3 IMPLANT
LEGGING LITHOTOMY PAIR STRL (DRAPES) ×3 IMPLANT
MESH BIO-A 7X10 SYN MAT (Mesh General) ×2 IMPLANT
NDL INSUFFLATION 14GA 120MM (NEEDLE) ×1 IMPLANT
NEEDLE INSUFFLATION 14GA 120MM (NEEDLE) ×3 IMPLANT
PAD POSITIONING PINK XL (MISCELLANEOUS) IMPLANT
POSITIONER SURGICAL ARM (MISCELLANEOUS) IMPLANT
RELOAD STAPLE 4.0 BLU F/HERNIA (INSTRUMENTS) IMPLANT
RELOAD STAPLE 4.8 BLK F/HERNIA (STAPLE) IMPLANT
RELOAD STAPLE HERNIA 4.0 BLUE (INSTRUMENTS) IMPLANT
RELOAD STAPLE HERNIA 4.8 BLK (STAPLE) ×3 IMPLANT
SCISSORS LAP 5X35 DISP (ENDOMECHANICALS) ×3 IMPLANT
SET IRRIG TUBING LAPAROSCOPIC (IRRIGATION / IRRIGATOR) ×3 IMPLANT
SHEARS CURVED HARMONIC AC 45CM (MISCELLANEOUS) ×2 IMPLANT
SHEARS HARMONIC ACE PLUS 36CM (ENDOMECHANICALS) IMPLANT
SLEEVE XCEL OPT CAN 5 100 (ENDOMECHANICALS) ×12 IMPLANT
SOLUTION ANTI FOG 6CC (MISCELLANEOUS) ×3 IMPLANT
STAPLER HERNIA 12 8.5 360D (INSTRUMENTS) ×5 IMPLANT
SUT ETHIBOND 2 0 SH (SUTURE) ×9
SUT ETHIBOND 2 0 SH 36X2 (SUTURE) ×3 IMPLANT
SUT MNCRL AB 4-0 PS2 18 (SUTURE) ×3 IMPLANT
TAPE CLOTH 4X10 WHT NS (GAUZE/BANDAGES/DRESSINGS) IMPLANT
TIP INNERVISION DETACH 40FR (MISCELLANEOUS) IMPLANT
TIP INNERVISION DETACH 50FR (MISCELLANEOUS) IMPLANT
TIP INNERVISION DETACH 56FR (MISCELLANEOUS) IMPLANT
TIPS INNERVISION DETACH 40FR (MISCELLANEOUS)
TOWEL OR 17X26 10 PK STRL BLUE (TOWEL DISPOSABLE) ×3 IMPLANT
TOWEL OR NON WOVEN STRL DISP B (DISPOSABLE) ×3 IMPLANT
TRAY FOLEY W/METER SILVER 16FR (SET/KITS/TRAYS/PACK) ×3 IMPLANT
TRAY LAPAROSCOPIC (CUSTOM PROCEDURE TRAY) ×3 IMPLANT
TROCAR BLADELESS OPT 5 100 (ENDOMECHANICALS) ×3 IMPLANT
TROCAR XCEL 12X100 BLDLESS (ENDOMECHANICALS) ×2 IMPLANT
TROCAR XCEL NON-BLD 11X100MML (ENDOMECHANICALS) IMPLANT
TUBING INSUF HEATED (TUBING) ×3 IMPLANT

## 2017-07-22 NOTE — Anesthesia Procedure Notes (Signed)
Procedure Name: Intubation Date/Time: 07/22/2017 8:42 AM Performed by: Lissa Morales, CRNA Pre-anesthesia Checklist: Patient identified, Emergency Drugs available, Suction available and Patient being monitored Patient Re-evaluated:Patient Re-evaluated prior to induction Oxygen Delivery Method: Circle system utilized Preoxygenation: Pre-oxygenation with 100% oxygen Induction Type: IV induction Ventilation: Mask ventilation without difficulty Laryngoscope Size: Mac and 4 Grade View: Grade I Tube type: Oral Tube size: 8.0 mm Number of attempts: 1 Airway Equipment and Method: Stylet and Oral airway Placement Confirmation: ETT inserted through vocal cords under direct vision,  positive ETCO2 and breath sounds checked- equal and bilateral Secured at: 23.5 cm Tube secured with: Tape Dental Injury: Teeth and Oropharynx as per pre-operative assessment

## 2017-07-22 NOTE — Op Note (Signed)
07/22/2017  11:20 AM  PATIENT:  Dellia Nims  72 y.o. male  PRE-OPERATIVE DIAGNOSIS:  hiatal hernia  POST-OPERATIVE DIAGNOSIS:  hiatal hernia  PROCEDURE:  Procedure(s): LAPAROSCOPIC REPAIR OF HIATAL HERNIA WITH NISSEN FUNDOPLICATION (N/A) INSERTION OF MESH (N/A)  SURGEON:  Surgeon(s) and Role:    * Ralene Ok, MD - Primary    * Fanny Skates, MD - Assisting  ANESTHESIA:   local and general  EBL:  25 mL   BLOOD ADMINISTERED:none  DRAINS: none   LOCAL MEDICATIONS USED:  BUPIVICAINE   SPECIMEN:  No Specimen  DISPOSITION OF SPECIMEN:  N/A  COUNTS:  YES  TOURNIQUET:  * No tourniquets in log *  DICTATION: .Dragon Dictation The patient was taken back to the operating room and placed in the lithotomy position with bilateral SCDs in place. She was prepped and draped in the usual sterile fashion.  A foley catheter was placed.  A timeout was called and off all facts and antibiotics were confirmed.  A Veress needle technique was used to insufflate the abdomen to 14 mm of mercury. This was done the midclavicular line just lateral to the umbilicus. A 5 mm trocar and camera were then placed intra-abdominally. Injury to any intra-abdominal organs. A 12 mm trocar was then placed in the epigastrium and the midline under direct visualization.  A 5 mm trocar was then placed in the left subcostal margin, left lower quadrant, and umbilicus under direct visualization. At this time a Nathanson liver retractor was then placed to retract the liver. At this time it could be seen that there was a large amount of omentumand stomach within the right chest. This was grasped and brought down to the abdomen.   At this time we began to dissect the hernia sac anteriorly. We dissected this circumferentially around the esophagus. And up into the anterior mediastinum. We proceeded to dissect the left portion of the esophagus and hiatal hernia sac. This brought [place] to the left crus. At this point we retracted  the esophagus to the left and dissected the right crus, until the left crus could be seen in the most posterior portion.  A Penrose drain was then placed around the esophagus to help with retraction.  At this time we continued to dissect the surrounding adventitial tissue around the esophagus cephalad in the mediastinum approximately 4-5cm proximally. This allowed the stomach to lay within the abdomen with undue tension.  We then began to dissect away the omentum and short gastrics on the greater curvature off the stomach. This allowed [place] to release some tension of the stomach to allow [place] to dissect the left crus more appropriately.  At this time we were able to visualize the crus had a large 3-4cm defect. 0 Ethibond stitches were then used in a figure-of-eight fashion 3. This allowed the crus to be reapproximated  without strangulation or narrowing of the esophagus. At this time a piece of Gore Bio-A mesh was cut to shape and placed into the abdomen. This was then placed over the suture repair posterior to the esophagus over the hiatal repair and stapled using the [employer] Universal hernia stapler, with a 4.8 mm load. This allowed the mesh to lay flat against the hiatal repair.  At this time the esophagus was retracted inferiorly. We proceeded to pass the greater curvature of the stomach posterior to the esophagus. A shoeshine technique was performed. At this time the Nissen fundoplication was sutured using 0 Ethibonds and interrupted standard fashion approximately 1  cm apart approximately 2-3 cm in length. The middle and superior stitch incorporated a thin layer of the esophagus into the wrap. The wrap laid approximately the 11:00 position. 0 Ethibonds were used as collar stitches to secure the wrap to the hiatus. These were done in interrupted fashion.     At this time the area checked for hemostasis which was excellent.  the 12 mm trocar site was reapproximated using a suture passer and 0 Vicryl  interrupted standard fashion.The pneumoperitoneum was evacuated all trochars were removed. All trocar sites were then reapproximated using a 4-0 Monocryl in a subcuticular fashion. the skin was dressed with a Dermabond.      PLAN OF CARE: Admit to inpatient   PATIENT DISPOSITION:  PACU - hemodynamically stable.   Delay start of Pharmacological VTE agent (>24hrs) due to surgical blood loss or risk of bleeding: yes

## 2017-07-22 NOTE — Anesthesia Postprocedure Evaluation (Signed)
Anesthesia Post Note  Patient: Alexander Lambert  Procedure(s) Performed: LAPAROSCOPIC REPAIR OF HIATAL HERNIA WITH NISSEN FUNDOPLICATION (N/A Abdomen) INSERTION OF MESH (N/A Abdomen)     Patient location during evaluation: PACU Anesthesia Type: General Level of consciousness: awake and alert Pain management: pain level controlled Vital Signs Assessment: post-procedure vital signs reviewed and stable Respiratory status: spontaneous breathing, nonlabored ventilation and respiratory function stable Cardiovascular status: blood pressure returned to baseline and stable Postop Assessment: no apparent nausea or vomiting Anesthetic complications: no    Last Vitals:  Vitals:   07/22/17 1245 07/22/17 1300  BP: 121/89 125/77  Pulse: 93 88  Resp: 17 16  Temp: 36.8 C 36.7 C  SpO2: 100% 100%    Last Pain:  Vitals:   07/22/17 1245  TempSrc:   PainSc: 0-No pain                 Catalina Gravel

## 2017-07-22 NOTE — Anesthesia Preprocedure Evaluation (Addendum)
Anesthesia Evaluation  Patient identified by MRN, date of birth, ID band Patient awake    Reviewed: Allergy & Precautions, NPO status , Patient's Chart, lab work & pertinent test results  Airway Mallampati: II  TM Distance: >3 FB Neck ROM: Full    Dental  (+) Teeth Intact, Dental Advisory Given   Pulmonary former smoker,    Pulmonary exam normal breath sounds clear to auscultation       Cardiovascular Exercise Tolerance: Good + Peripheral Vascular Disease  Normal cardiovascular exam Rhythm:Regular Rate:Normal     Neuro/Psych PSYCHIATRIC DISORDERS Anxiety Depression negative neurological ROS     GI/Hepatic Neg liver ROS, hiatal hernia, GERD  Medicated,Crohn's disease Esophageal stricture   Endo/Other  negative endocrine ROS  Renal/GU negative Renal ROS     Musculoskeletal  (+) Fibromyalgia -Raynaud's   Abdominal   Peds  Hematology negative hematology ROS (+)   Anesthesia Other Findings Day of surgery medications reviewed with the patient.  Reproductive/Obstetrics                            Anesthesia Physical Anesthesia Plan  ASA: II  Anesthesia Plan: General   Post-op Pain Management:    Induction: Intravenous  PONV Risk Score and Plan: 3 and Dexamethasone, Ondansetron and Treatment may vary due to age or medical condition  Airway Management Planned: Oral ETT  Additional Equipment:   Intra-op Plan:   Post-operative Plan: Extubation in OR  Informed Consent: I have reviewed the patients History and Physical, chart, labs and discussed the procedure including the risks, benefits and alternatives for the proposed anesthesia with the patient or authorized representative who has indicated his/her understanding and acceptance.   Dental advisory given  Plan Discussed with: CRNA  Anesthesia Plan Comments:         Anesthesia Quick Evaluation

## 2017-07-22 NOTE — Discharge Instructions (Signed)
EATING AFTER YOUR ESOPHAGEAL SURGERY (Stomach Fundoplication, Hiatal Hernia repair, Achalasia surgery, etc)  ######################################################################  EAT Start with a pureed / full liquid diet (see below) Gradually transition to a high fiber diet with a fiber supplement over the next month after discharge.    WALK Walk an hour a day.  Control your pain to do that.    CONTROL PAIN Control pain so that you can walk, sleep, tolerate sneezing/coughing, go up/down stairs.  HAVE A BOWEL MOVEMENT DAILY Keep your bowels regular to avoid problems.  OK to try a laxative to override constipation.  OK to use an antidairrheal to slow down diarrhea.  Call if not better after 2 tries  CALL IF YOU HAVE PROBLEMS/CONCERNS Call if you are still struggling despite following these instructions. Call if you have concerns not answered by these instructions  ######################################################################   After your esophageal surgery, expect some sticking with swallowing over the next 1-2 months.    If food sticks when you eat, it is called "dysphagia".  This is due to swelling around your esophagus at the wrap & hiatal diaphragm repair.  It will gradually ease off over the next few months.  To help you through this temporary phase, we start you out on a pureed (blenderized) diet.  Your first meal in the hospital was thin liquids.  You should have been given a pureed diet by the time you left the hospital.  We ask patients to stay on a pureed diet for the first 2-3 weeks to avoid anything getting "stuck" near your recent surgery.  Don't be alarmed if your ability to swallow doesn't progress according to this plan.  Everyone is different and some diets can advance more or less quickly.     Some BASIC RULES to follow are:  Maintain an upright position whenever eating or drinking.  Take small bites - just a teaspoon size bite at a time.  Eat slowly.   It may also help to eat only one food at a time.  Consider nibbling through smaller, more frequent meals & avoid the urge to eat BIG meals  Do not push through feelings of fullness, nausea, or bloatedness  Do not mix solid foods and liquids in the same mouthful  Try not to "wash foods down" with large gulps of liquids.  Avoid carbonated (bubbly/fizzy) drinks.    Avoid foods that make you feel gassy or bloated.  Start with bland foods first.  Wait on trying greasy, fried, or spicy meals until you are tolerating more bland solids well.  Understand that it will be hard to burp and belch at first.  This gradually improves with time.  Expect to be more gassy/flatulent/bloated initially.  Walking will help your body manage it better.  Consider using medications for bloating that contain simethicone such as  Maalox or Gas-X   Eat in a relaxed atmosphere & minimize distractions.  Avoid talking while eating.    Do not use straws.  Following each meal, sit in an upright position (90 degree angle) for 60 to 90 minutes.  Going for a short walk can help as well  If food does stick, don't panic.  Try to relax and let the food pass on its own.  Sipping WARM LIQUID such as strong hot black tea can also help slide it down.   Be gradual in changes & use common sense:  -If you easily tolerating a certain "level" of foods, advance to the next level gradually -If you are  having trouble swallowing a particular food, then avoid it.   -If food is sticking when you advance your diet, go back to thinner previous diet (the lower LEVEL) for 1-2 days.  LEVEL 1 = PUREED DIET  Do for the first 2 WEEKS AFTER SURGERY  -Foods in this group are pureed or blenderized to a smooth, mashed potato-like consistency.  -If necessary, the pureed foods can keep their shape with the addition of a thickening agent.   -Meat should be pureed to a smooth, pasty consistency.  Hot broth or gravy may be added to the pureed  meat, approximately 1 oz. of liquid per 3 oz. serving of meat. -CAUTION:  If any foods do not puree into a smooth consistency, swallowing will be more difficult.  (For example, nuts or seeds sometimes do not blend well.)  Hot Foods Cold Foods  Pureed scrambled eggs and cheese Pureed cottage cheese  Baby cereals Thickened juices and nectars  Thinned cooked cereals (no lumps) Thickened milk or eggnog  Pureed [place] toast or pancakes Ensure  Mashed potatoes Ice cream  Pureed parsley, au gratin, scalloped potatoes, candied sweet potatoes Fruit or [place] ice, sherbet  Pureed buttered or alfredo noodles Plain yogurt  Pureed vegetables (no corn or peas) Instant breakfast  Pureed soups and creamed soups Smooth pudding, mousse, custard  Pureed scalloped apples Whipped gelatin  Gravies Sugar, syrup, honey, jelly  Sauces, cheese, tomato, barbecue, white, creamed Cream  Any baby food Creamer  Alcohol in moderation (not beer or champagne) Margarine  Coffee or tea Mayonnaise   Ketchup, mustard   Apple sauce   SAMPLE MENU:  PUREED DIET Breakfast Lunch Dinner   Orange juice, 1/2 cup  Cream of wheat, 1/2 cup  Pineapple juice, 1/2 cup  Pureed [place], barley soup, 3/4 cup  Pureed Hawaiian chicken, 3 oz   Scrambled eggs, mashed or blended with cheese, 1/2 cup  Tea or coffee, 1 cup   Whole milk, 1 cup   Non-dairy creamer, 2 Tbsp.  Mashed potatoes, 1/2 cup  Pureed cooled broccoli, 1/2 cup  Apple sauce, 1/2 cup  Coffee or tea  Mashed potatoes, 1/2 cup  Pureed spinach, 1/2 cup  Frozen yogurt, 1/2 cup  Tea or coffee      LEVEL 2 = SOFT DIET  After your first 2 weeks, you can advance to a soft diet.   Keep on this diet until everything goes down easily.  Hot Foods Cold Foods  White fish Cottage cheese  Stuffed fish Junior baby fruit  Baby food meals Semi thickened juices  Minced soft cooked, scrambled, poached eggs nectars  Souffle & omelets Ripe mashed bananas  Cooked  cereals Canned fruit, pineapple sauce, milk  potatoes Milkshake  Buttered or Alfredo noodles Custard  Cooked cooled vegetable Puddings, including tapioca  Sherbet Yogurt  Vegetable soup or alphabet soup Fruit ice, [place] ice  Gravies Whipped gelatin  Sugar, syrup, honey, jelly Junior baby desserts  Sauces:  Cheese, creamed, barbecue, tomato, white Cream  Coffee or tea Margarine   SAMPLE MENU:  LEVEL 2 Breakfast Lunch Dinner   Orange juice, 1/2 cup  Oatmeal, 1/2 cup  Scrambled eggs with cheese, 1/2 cup  Decaffeinated tea, 1 cup  Whole milk, 1 cup  Non-dairy creamer, 2 Tbsp  Pineapple juice, 1/2 cup  Minced beef, 3 oz  Gravy, 2 Tbsp  Mashed potatoes, 1/2 cup  Minced fresh broccoli, 1/2 cup  Applesauce, 1/2 cup  Coffee, 1 cup  [place], barley soup, 3/4 cup  Minced Hawaiian chicken, 3 oz  Mashed potatoes, 1/2 cup  Cooked spinach, 1/2 cup  Frozen yogurt, 1/2 cup  Non-dairy creamer, 2 Tbsp      LEVEL 3 = CHOPPED DIET  -After all the foods in level 2 (soft diet) are passing through well you should advance up to more chopped foods.  -It is still important to cut these foods into small pieces and eat slowly.  Hot Foods Cold Foods  Poultry Cottage cheese  Chopped Swedish meatballs Yogurt  Meat salads (ground or flaked meat) Milk  Flaked fish (tuna) Milkshakes  Poached or scrambled eggs Soft, cold, dry cereal  Souffles and omelets Fruit juices or nectars  Cooked cereals Chopped canned fruit  Chopped [place] toast or pancakes Canned fruit cocktail  Noodles or pasta (no rice) Pudding, mousse, custard  Cooked vegetables (no frozen peas, corn, or mixed vegetables) Green salad  Canned small sweet peas Ice cream  Creamed soup or vegetable soup Fruit ice, [place] ice  Pureed vegetable soup or alphabet soup Non-dairy creamer  Ground scalloped apples Margarine  Gravies Mayonnaise  Sauces:  Cheese, creamed, barbecue, tomato, white Ketchup  Coffee or tea Mustard    SAMPLE MENU:  LEVEL 3 Breakfast Lunch Dinner   Orange juice, 1/2 cup  Oatmeal, 1/2 cup  Scrambled eggs with cheese, 1/2 cup  Decaffeinated tea, 1 cup  Whole milk, 1 cup  Non-dairy creamer, 2 Tbsp  Ketchup, 1 Tbsp  Margarine, 1 tsp  Salt, 1/4 tsp  Sugar, 2 tsp  Pineapple juice, 1/2 cup  Ground beef, 3 oz  Gravy, 2 Tbsp  Mashed potatoes, 1/2 cup  Cooked spinach, 1/2 cup  Applesauce, 1/2 cup  Decaffeinated coffee  Whole milk  Non-dairy creamer, 2 Tbsp  Margarine, 1 tsp  Salt, 1/4 tsp  Pureed [place], barley soup, 3/4 cup  Barbecue chicken, 3 oz  Mashed potatoes, 1/2 cup  Ground fresh broccoli, 1/2 cup  Frozen yogurt, 1/2 cup  Decaffeinated tea, 1 cup  Non-dairy creamer, 2 Tbsp  Margarine, 1 tsp  Salt, 1/4 tsp  Sugar, 1 tsp    LEVEL 4:  REGULAR FOODS  -Foods in this group are soft, moist, regularly textured foods.   -This level includes meat and breads, which tend to be the hardest things to swallow.   -Eat very slowly, chew well and continue to avoid carbonated drinks. -most people are at this level in 4-6 weeks  Hot Foods Cold Foods  Baked fish or skinned Soft cheeses - cottage cheese  Souffles and omelets Cream cheese  Eggs Yogurt  Stuffed shells Milk  Spaghetti with meat sauce Milkshakes  Cooked cereal Cold dry cereals (no nuts, dried fruit, coconut)  [place] toast or pancakes Crackers  Buttered toast Fruit juices or nectars  Noodles or pasta (no rice) Canned fruit  Potatoes (all types) Ripe bananas  Soft, cooked vegetables (no corn, lima, or baked beans) Peeled, ripe, fresh fruit  Creamed soups or vegetable soup Cakes (no nuts, dried fruit, coconut)  Canned chicken noodle soup Plain doughnuts  Gravies Ice cream  Bacon dressing Pudding, mousse, custard  Sauces:  Cheese, creamed, barbecue, tomato, white Fruit ice, [place] ice, sherbet  Decaffeinated tea or coffee Whipped gelatin  Pork chops Regular gelatin   Canned fruited  gelatin molds   Sugar, syrup, honey, jam, jelly   Cream   Non-dairy   Margarine   Oil   Mayonnaise   Ketchup   Mustard   TROUBLESHOOTING IRREGULAR BOWELS  1) Avoid extremes of bowel  movements (no bad constipation/diarrhea)  °2) Miralax 17gm mixed in 8oz. water or juice-daily. May use BID as needed.  °3) Gas-x,Phazyme, etc. as needed for gas & bloating.  °4) Soft,bland diet. No spicy,greasy,fried foods.  °5) Prilosec over-the-counter as needed  °6) May hold gluten/wheat products from diet to see if symptoms improve.  °7) May try probiotics (Align, Activa, etc) to help calm the bowels down  °7) If symptoms become worse call back immediately. ° ° ° °If you have any questions please call our office at [facility]: [phone]. ° °

## 2017-07-22 NOTE — Transfer of Care (Signed)
Immediate Anesthesia Transfer of Care Note  Patient: Alexander Lambert  Procedure(s) Performed: LAPAROSCOPIC REPAIR OF HIATAL HERNIA WITH NISSEN FUNDOPLICATION (N/A Abdomen) INSERTION OF MESH (N/A Abdomen)  Patient Location: PACU  Anesthesia Type:General  Level of Consciousness: awake, alert , oriented and patient cooperative  Airway & Oxygen Therapy: Patient Spontanous Breathing and Patient connected to face mask oxygen  Post-op Assessment: Report given to RN, Post -op Vital signs reviewed and stable and Patient moving all extremities X 4  Post vital signs: stable  Last Vitals:  Vitals:   07/22/17 0652  BP: (!) 135/91  Pulse: 88  Resp: 18  Temp: 36.7 C  SpO2: 100%    Last Pain:  Vitals:   07/22/17 0652  TempSrc: Oral         Complications: No apparent anesthesia complications

## 2017-07-22 NOTE — Interval H&P Note (Signed)
History and Physical Interval Note:  07/22/2017 7:48 AM  Alexander Lambert  has presented today for surgery, with the diagnosis of hiatal hernia  The various methods of treatment have been discussed with the patient and family. After consideration of risks, benefits and other options for treatment, the patient has consented to  Procedure(s): [place] (N/A) INSERTION OF MESH (N/A) as a surgical intervention .  The patient's history has been reviewed, patient examined, no change in status, stable for surgery.  I have reviewed the patient's chart and labs.  Questions were answered to the patient's satisfaction.     Rosario Jacks., Anne Hahn

## 2017-07-23 ENCOUNTER — Inpatient Hospital Stay (HOSPITAL_COMMUNITY): Payer: PPO

## 2017-07-23 LAB — BASIC METABOLIC PANEL
Anion gap: 8 (ref 5–15)
BUN: 12 mg/dL (ref 6–20)
CALCIUM: 8.5 mg/dL — AB (ref 8.9–10.3)
CO2: 30 mmol/L (ref 22–32)
Chloride: 103 mmol/L (ref 101–111)
Creatinine, Ser: 0.99 mg/dL (ref 0.61–1.24)
GFR calc Af Amer: >60 mL/min (ref >60)
GLUCOSE: 131 mg/dL — AB (ref 65–99)
Potassium: 4.4 mmol/L (ref 3.5–5.1)
SODIUM: 141 mmol/L (ref 135–145)

## 2017-07-23 MED ORDER — IOPAMIDOL (ISOVUE-300) INJECTION 61%
INTRAVENOUS | Status: AC
  Administered 2017-07-23: 60 mL via ORAL
  Filled 2017-07-23: qty 150

## 2017-07-23 MED ORDER — TRAMADOL 5 MG/ML ORAL SUSPENSION: 50.0000 mg | Freq: Four times a day (QID) | ORAL | 0 refills | Status: DC | PRN

## 2017-07-23 NOTE — Progress Notes (Signed)
1 Day Post-Op   Subjective/Chief Complaint: PT doing well. Pain controlled Ambulating in hall   Objective: Vital signs in last 24 hours: Temp:  [97.6 F (36.4 C)-98.5 F (36.9 C)] 98.5 F (36.9 C) (03/14 0603) Pulse Rate:  [64-93] 67 (03/14 0603) Resp:  [8-17] 16 (03/14 0603) BP: (112-143)/(71-89) 131/86 (03/14 0603) SpO2:  [86 %-100 %] 99 % (03/14 0603)    Intake/Output from previous day: 03/13 0701 - 03/14 0700 In: 3438.3 [I.V.:3438.3] Out: 1045 [Urine:1005; Blood:40] Intake/Output this shift: No intake/output data recorded.  General appearance: alert and cooperative GI: soft, non-tender; bowel sounds normal; no masses,  no organomegaly incisions c/d/i   Lab Results:  No results for input(s): WBC, HGB, HCT, PLT in the last 72 hours. BMET Recent Labs    07/23/17 0449  NA 141  K 4.4  CL 103  CO2 30  GLUCOSE 131*  BUN 12  CREATININE 0.99  CALCIUM 8.5*   PT/INR No results for input(s): LABPROT, INR in the last 72 hours. ABG No results for input(s): PHART, HCO3 in the last 72 hours.  Invalid input(s): PCO2, PO2  Studies/Results: No results found.  Anti-infectives: Anti-infectives (From admission, onward)   Start     Dose/Rate Route Frequency Ordered Stop   07/22/17 0659  ceFAZolin (ANCEF) IVPB 2g/100 mL premix     2 g 200 mL/hr over 30 Minutes Intravenous On call to O.R. 07/22/17 5465 07/22/17 0838      Assessment/Plan: s/p Procedure(s): LAPAROSCOPIC REPAIR OF HIATAL HERNIA WITH NISSEN FUNDOPLICATION (N/A) INSERTION OF MESH (N/A)  -Await esophagogram.  If OK will start liquid diet -Pt states he is ready to go home today if all test come back OK and tol PO -RX for pain med on chart   LOS: 1 day    Rosario Jacks., Anne Hahn 07/23/2017

## 2017-07-23 NOTE — Progress Notes (Signed)
Discharge instructions reviewed with patient. All questions answered. Patient ambulated down to vehicle with belongings with nurse tech

## 2017-07-23 NOTE — Plan of Care (Signed)
Nutrition Education Note  RD consulted for nutrition education regarding patient who is s/p nissen fundoplication. Pt to be on a liquid diet for 2 weeks.   RD provided handout on Nissen Fundoplication nutrition therapy. Reviewed clear and full liquids. Encouraged pt to avoid caffeine, carbonated beverages and use of straws.  Provided examples of appropriate foods on a soft diet. Reviewed gas producing foods. Discouraged intake of processed foods and red meat. Recommended use of a liquid multivitamin while following a liquid diet. Reviewed acceptable protein supplements.  RD discussed why it is important for patient to adhere to diet recommendations. Teach back method used.  Expect good compliance. Wife at bedside present for education.  Body mass index is 27.84 kg/m.  Pt meets criteria for overweight based on current BMI.  Current diet order is NPO. Diet to be advanced today per surgery note. Labs and medications reviewed. No further nutrition interventions warranted at this time. If additional nutrition issues arise, please re-consult RD.   Clayton Bibles, MS, RD, [place] [facility] Pager: [phone] After Hours Pager: [phone]

## 2017-07-24 NOTE — Discharge Summary (Signed)
Physician Discharge Summary  Patient ID: Alexander Lambert MRN: [id number] DOB/AGE: Nov 07, 1945 72 y.o.  Admit date: 07/22/2017 Discharge date: 07/24/2017  Admission Diagnoses: hiatal hernia  Discharge Diagnoses:  Active Problems:   S/P Nissen fundoplication (without gastrostomy tube) procedure   Discharged Condition: good  Hospital Course: patient underwent laparoscopic hiatal hernia repair with mesh and Nissen fundoplication.  Please see operative note for full details.  Postoperatively patient was sent for.  Patient was emulated on postoperative day 0.  He had good pain control.  On postoperative day one patient underwent esophagram to evaluate for possible esophageal leak.  This was negative for leak.  Patient was started on liquid diet.  He tolerated that well in advance to full liquid diet.  Patient a consultation with dietitian.  Patient was otherwise afebrile, pain control, was deemed stable for discharge and discharge home.  Consults: dietitian  Significant Diagnostic Studies: esophagogram with negative esophageal leak  Treatments: surgery: as above  Discharge Exam: Blood pressure (!) 148/86, pulse 86, temperature 98.6 F (37 C), temperature source Oral, resp. rate 16, height 5\' 10"  (1.778 m), weight 88 kg (194 lb), SpO2 98 %. General appearance: alert and cooperative Cardio: regular rate and rhythm, S1, S2 normal, no murmur, click, rub or gallop GI: soft, non-tender; bowel sounds normal; no masses,  no organomegaly and incisions clean dry and intact  Disposition: 01-Home or Self Care  Discharge Instructions    Call MD for:   Complete by:  As directed    Temperature > 101.66F   Call MD for:  extreme fatigue   Complete by:  As directed    Call MD for:  hives   Complete by:  As directed    Call MD for:  persistant nausea and vomiting   Complete by:  As directed    Call MD for:  redness, tenderness, or signs of infection (pain, swelling, redness, odor or green/yellow  discharge around incision site)   Complete by:  As directed    Call MD for:  severe uncontrolled pain   Complete by:  As directed    Diet general   Complete by:  As directed    SEE ESOPHAGEAL SURGERY DIET INSTRUCTIONS  We using usually start you out on a pureed (blenderized) diet. Expect some sticking with swallowing over the next 1-2 months.   This is due to swelling around your esophagus at the wrap & hiatal diaphragm repair.  It will gradually ease off over the next few months.   Discharge instructions   Complete by:  As directed    Please see discharge instruction sheets.   Also refer to any handouts/printouts that may have been given from the [facility] surgery office (if you visited [place] there before surgery) Please call our office if you have any questions or concerns [phone]   Driving Restrictions   Complete by:  As directed    No driving until off narcotics and can safely swerve away without pain during an emergency   Increase activity slowly   Complete by:  As directed    Lifting restrictions   Complete by:  As directed    Avoid heavy lifting initially, <20 pounds at first.   Do not push through pain.   You have no specific weight limit: If it hurts to do, DON'T DO IT.    If you feel no pain, you are not injuring anything.  Pain will protect you from injury.   Coughing and sneezing are far more stressful  to your incision than any lifting.   Avoid resuming heavy lifting (>50 pounds) or other intense activity until off all narcotic pain medications.   When want to exercise more, give yourself 2 weeks to gradually get back to full intense exercise/activity.   May shower / Bathe   Complete by:  As directed    [place].  It is fine for dressings or wounds to be washed/rinsed.  Use gentle soap & water.  This will help the incisions and/or wounds get clean & minimize infection.   May walk up steps   Complete by:  As directed    Remove dressing in 72 hours   Complete by:   As directed    You have closed incisions: Shower and bathe over these incisions with soap and water every day.  It is OK to wash over the dressings: they are waterproof. Remove all surgical dressings on postoperative day #3.  You do not need to replace dressings over the closed incisions unless you feel more comfortable with a Band-Aid covering it.   Please call our office [phone] if you have further questions.   Sexual Activity Restrictions   Complete by:  As directed    Sexual activity as tolerated.  Do not push through pain.  Pain will protect you from injury.   Walk with assistance   Complete by:  As directed    Walk over an hour a day.  May use a walker/cane/companion to help with balance and stamina.     Allergies as of 07/23/2017      Reactions   Phenergan [promethazine Hcl] Other (See Comments)   "makes him crazy"      Medication List    TAKE these medications   buPROPion 100 MG tablet Commonly known as:  WELLBUTRIN Take 100 mg by mouth daily.   Co-Enzyme Q10 200 MG Caps Take 200 mg by mouth daily.   DULoxetine 60 MG capsule Commonly known as:  CYMBALTA Take 60 mg by mouth daily.   ergocalciferol 50000 units capsule Commonly known as:  VITAMIN D2 Take 50,000 Units by mouth 2 (two) times a week.   ferrous sulfate 325 (65 FE) MG tablet TAKE ONE TABLET TWICE DAILY What changed:    how much to take  how to take this  when to take this   ibandronate 150 MG tablet Commonly known as:  BONIVA Take 150 mg by mouth every 30 (thirty) days. Take in the morning with a full glass of water, on an empty stomach, and do not take anything else by mouth or lie down for the next 30 min.   omeprazole 40 MG capsule Commonly known as:  PRILOSEC TAKE ONE CAPSULE EACH DAY What changed:  See the new instructions.   traMADol 5 mg/mL Susp Commonly known as:  ULTRAM Take 10 mLs (50 mg total) by mouth every 6 (six) hours as needed.   traZODone 100 MG tablet Commonly known  as:  DESYREL Take 100 mg by mouth at bedtime.   vitamin B-12 1000 MCG tablet Commonly known as:  CYANOCOBALAMIN Take 1,000 mcg by mouth daily.   zolpidem 5 MG tablet Commonly known as:  AMBIEN Take 5 mg by mouth at bedtime as needed for sleep.      Follow-up Information    Ralene Ok, MD. Schedule an appointment as soon as possible for a visit in 2 weeks.   Specialty:  General Surgery Why:  For wound re-check Contact information: [address] [place] [address] [phone]           Signed: Rosario Jacks., [facility] 07/24/2017, 9:02 AM

## 2017-08-10 DIAGNOSIS — E538 Deficiency of other specified B group vitamins: Secondary | ICD-10-CM | POA: Diagnosis not present

## 2017-08-10 DIAGNOSIS — M810 Age-related osteoporosis without current pathological fracture: Secondary | ICD-10-CM | POA: Diagnosis not present

## 2017-08-10 DIAGNOSIS — E7849 Other hyperlipidemia: Secondary | ICD-10-CM | POA: Diagnosis not present

## 2017-08-10 DIAGNOSIS — M109 Gout, unspecified: Secondary | ICD-10-CM | POA: Diagnosis not present

## 2017-08-10 DIAGNOSIS — R7301 Impaired fasting glucose: Secondary | ICD-10-CM | POA: Diagnosis not present

## 2017-08-10 DIAGNOSIS — R82998 Other abnormal findings in urine: Secondary | ICD-10-CM | POA: Diagnosis not present

## 2017-08-10 DIAGNOSIS — Z125 Encounter for screening for malignant neoplasm of prostate: Secondary | ICD-10-CM | POA: Diagnosis not present

## 2017-08-18 DIAGNOSIS — I2510 Atherosclerotic heart disease of native coronary artery without angina pectoris: Secondary | ICD-10-CM | POA: Diagnosis not present

## 2017-08-18 DIAGNOSIS — Z0000 Encounter for general adult medical examination without abnormal findings: Secondary | ICD-10-CM | POA: Diagnosis not present

## 2017-08-18 DIAGNOSIS — I7090 Unspecified atherosclerosis: Secondary | ICD-10-CM | POA: Diagnosis not present

## 2017-08-18 DIAGNOSIS — Z6829 Body mass index (BMI) 29.0-29.9, adult: Secondary | ICD-10-CM | POA: Diagnosis not present

## 2017-08-18 DIAGNOSIS — M810 Age-related osteoporosis without current pathological fracture: Secondary | ICD-10-CM | POA: Diagnosis not present

## 2017-08-18 DIAGNOSIS — M545 Low back pain: Secondary | ICD-10-CM | POA: Diagnosis not present

## 2017-08-18 DIAGNOSIS — Z8673 Personal history of transient ischemic attack (TIA), and cerebral infarction without residual deficits: Secondary | ICD-10-CM | POA: Diagnosis not present

## 2017-08-18 DIAGNOSIS — Z1389 Encounter for screening for other disorder: Secondary | ICD-10-CM | POA: Diagnosis not present

## 2017-08-19 ENCOUNTER — Telehealth: Payer: Self-pay | Admitting: Internal Medicine

## 2017-08-19 NOTE — Telephone Encounter (Signed)
Pt had Nissen Fundoplication surgery 8/52. Pt reports he stopped his omeprazole prior to the surgery and is calling wanting to know if it is ok to resume the omeprazole. Reports he is not having any symptoms of reflux. Saw his PCP for a physical and states his PCP instructed him to ask Dr. Henrene Pastor if he should resume taking it. Please advise.

## 2017-08-19 NOTE — Telephone Encounter (Signed)
Spoke with pt and he is aware. 

## 2017-08-19 NOTE — Telephone Encounter (Signed)
If no symptoms, hold off on omeprazole. Thank you

## 2017-09-10 DIAGNOSIS — F1099 Alcohol use, unspecified with unspecified alcohol-induced disorder: Secondary | ICD-10-CM | POA: Diagnosis not present

## 2017-09-10 DIAGNOSIS — M797 Fibromyalgia: Secondary | ICD-10-CM | POA: Diagnosis not present

## 2017-09-10 DIAGNOSIS — F3289 Other specified depressive episodes: Secondary | ICD-10-CM | POA: Diagnosis not present

## 2017-09-10 DIAGNOSIS — Z6829 Body mass index (BMI) 29.0-29.9, adult: Secondary | ICD-10-CM | POA: Diagnosis not present

## 2017-09-23 DIAGNOSIS — F3289 Other specified depressive episodes: Secondary | ICD-10-CM | POA: Diagnosis not present

## 2017-09-23 DIAGNOSIS — D649 Anemia, unspecified: Secondary | ICD-10-CM | POA: Diagnosis not present

## 2017-09-23 DIAGNOSIS — G500 Trigeminal neuralgia: Secondary | ICD-10-CM | POA: Diagnosis not present

## 2017-09-23 DIAGNOSIS — Z6829 Body mass index (BMI) 29.0-29.9, adult: Secondary | ICD-10-CM | POA: Diagnosis not present

## 2017-09-23 DIAGNOSIS — M797 Fibromyalgia: Secondary | ICD-10-CM | POA: Diagnosis not present

## 2017-10-29 DIAGNOSIS — M797 Fibromyalgia: Secondary | ICD-10-CM | POA: Diagnosis not present

## 2017-10-29 DIAGNOSIS — F3289 Other specified depressive episodes: Secondary | ICD-10-CM | POA: Diagnosis not present

## 2017-10-29 DIAGNOSIS — Z6830 Body mass index (BMI) 30.0-30.9, adult: Secondary | ICD-10-CM | POA: Diagnosis not present

## 2017-10-29 DIAGNOSIS — G500 Trigeminal neuralgia: Secondary | ICD-10-CM | POA: Diagnosis not present

## 2017-12-23 DIAGNOSIS — F329 Major depressive disorder, single episode, unspecified: Secondary | ICD-10-CM | POA: Diagnosis not present

## 2017-12-23 DIAGNOSIS — Z6831 Body mass index (BMI) 31.0-31.9, adult: Secondary | ICD-10-CM | POA: Diagnosis not present

## 2017-12-23 DIAGNOSIS — G500 Trigeminal neuralgia: Secondary | ICD-10-CM | POA: Diagnosis not present

## 2017-12-23 DIAGNOSIS — E7849 Other hyperlipidemia: Secondary | ICD-10-CM | POA: Diagnosis not present

## 2017-12-23 DIAGNOSIS — M797 Fibromyalgia: Secondary | ICD-10-CM | POA: Diagnosis not present

## 2018-01-15 DIAGNOSIS — L821 Other seborrheic keratosis: Secondary | ICD-10-CM | POA: Diagnosis not present

## 2018-02-15 DIAGNOSIS — M5489 Other dorsalgia: Secondary | ICD-10-CM | POA: Diagnosis not present

## 2018-02-15 DIAGNOSIS — F3289 Other specified depressive episodes: Secondary | ICD-10-CM | POA: Diagnosis not present

## 2018-02-15 DIAGNOSIS — G500 Trigeminal neuralgia: Secondary | ICD-10-CM | POA: Diagnosis not present

## 2018-02-15 DIAGNOSIS — R7301 Impaired fasting glucose: Secondary | ICD-10-CM | POA: Diagnosis not present

## 2018-02-15 DIAGNOSIS — M797 Fibromyalgia: Secondary | ICD-10-CM | POA: Diagnosis not present

## 2018-02-15 DIAGNOSIS — I2510 Atherosclerotic heart disease of native coronary artery without angina pectoris: Secondary | ICD-10-CM | POA: Diagnosis not present

## 2018-02-15 DIAGNOSIS — I6389 Other cerebral infarction: Secondary | ICD-10-CM | POA: Diagnosis not present

## 2018-02-15 DIAGNOSIS — Z6832 Body mass index (BMI) 32.0-32.9, adult: Secondary | ICD-10-CM | POA: Diagnosis not present

## 2018-02-15 DIAGNOSIS — E7849 Other hyperlipidemia: Secondary | ICD-10-CM | POA: Diagnosis not present

## 2018-03-16 DIAGNOSIS — Z23 Encounter for immunization: Secondary | ICD-10-CM | POA: Diagnosis not present

## 2018-05-20 DIAGNOSIS — L720 Epidermal cyst: Secondary | ICD-10-CM | POA: Diagnosis not present

## 2018-06-23 DIAGNOSIS — G500 Trigeminal neuralgia: Secondary | ICD-10-CM | POA: Diagnosis not present

## 2018-07-06 DIAGNOSIS — Z6832 Body mass index (BMI) 32.0-32.9, adult: Secondary | ICD-10-CM | POA: Diagnosis not present

## 2018-07-06 DIAGNOSIS — G500 Trigeminal neuralgia: Secondary | ICD-10-CM | POA: Diagnosis not present

## 2018-07-06 DIAGNOSIS — N401 Enlarged prostate with lower urinary tract symptoms: Secondary | ICD-10-CM | POA: Diagnosis not present

## 2018-07-06 DIAGNOSIS — F3289 Other specified depressive episodes: Secondary | ICD-10-CM | POA: Diagnosis not present

## 2018-07-06 DIAGNOSIS — I6389 Other cerebral infarction: Secondary | ICD-10-CM | POA: Diagnosis not present

## 2018-07-06 DIAGNOSIS — I2510 Atherosclerotic heart disease of native coronary artery without angina pectoris: Secondary | ICD-10-CM | POA: Diagnosis not present

## 2018-07-06 DIAGNOSIS — M797 Fibromyalgia: Secondary | ICD-10-CM | POA: Diagnosis not present

## 2018-08-21 IMAGING — RF DG ESOPHAGUS
9 of 10 series · 13 of 16 positions shown · non-contrast
Comparison: 05/01/2004

CLINICAL DATA: Nissen fundoplication postop day 1

EXAM:
ESOPHOGRAM/BARIUM SWALLOW
TECHNIQUE: Single contrast examination was performed using  [id number].
FLUOROSCOPY TIME:  Fluoroscopy Time:  1 minutes 56 seconds
Radiation Exposure Index (if provided by the fluoroscopic device):
Number of Acquired Spot Images: 0

[Series 1: cp_standard · 0.37mm/px · 3 of 178 frames shown (1 of 2)]
[frame 8/178]
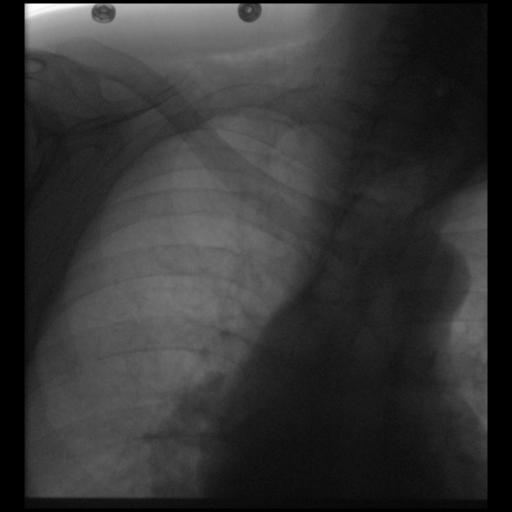
[frame 27/178]
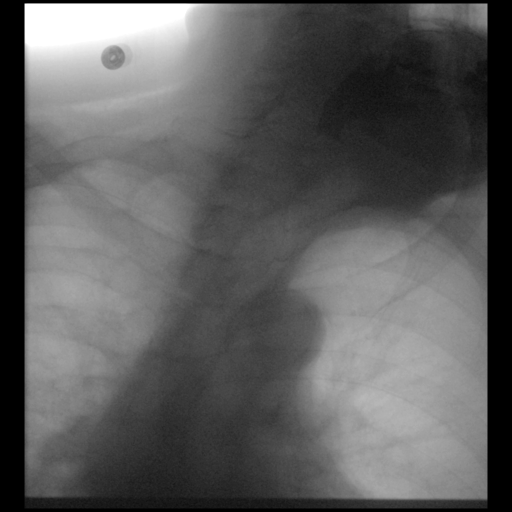
[frame 152/178]
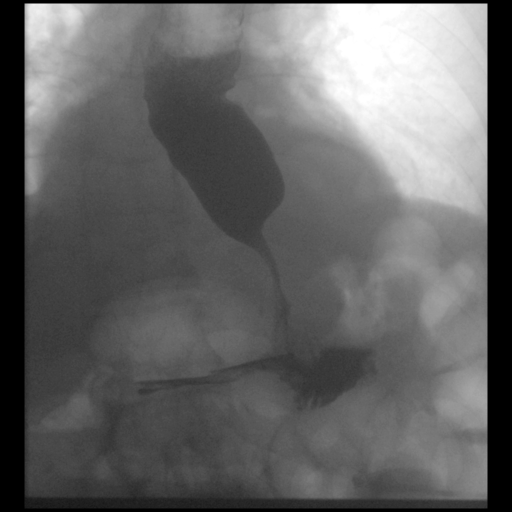

[Series 2: fluoro_iodine 2fps_bw · 0.19mm/px · 1 of 1 slices shown (1 of 7)]
[im 1/1]
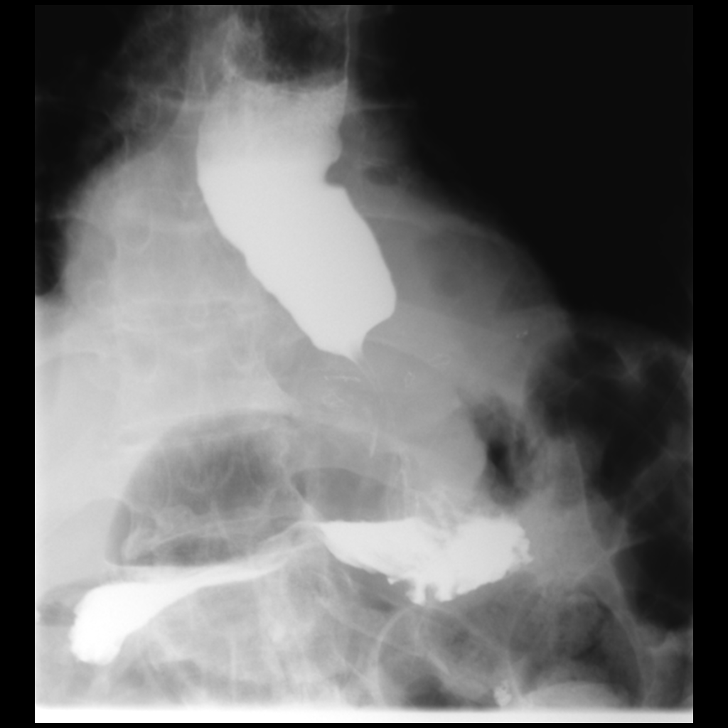

[Series 3: fluoro_iodine 2fps_bw · 0.19mm/px · 1 of 1 slices shown (2 of 7)]
[im 1/1]
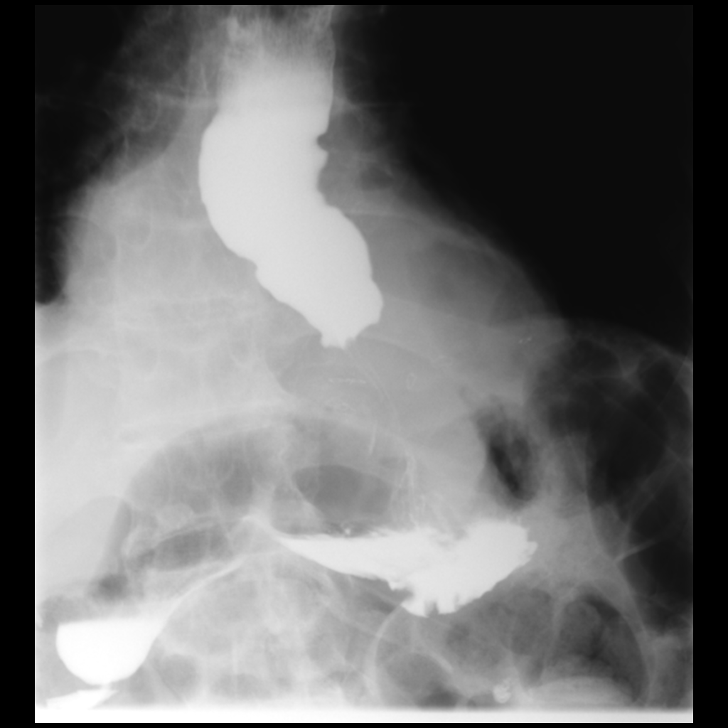

[Series 4: cp_standard · 0.37mm/px · 3 of 67 frames shown (2 of 2)]
[frame 11/67]
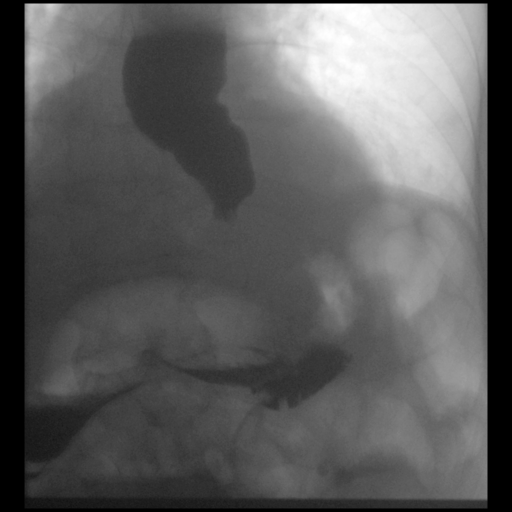
[frame 34/67]
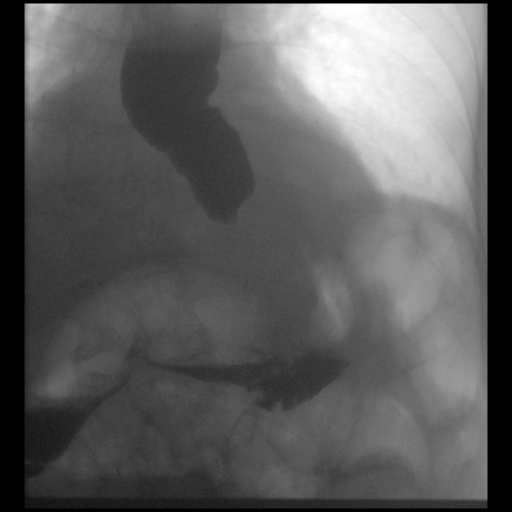
[frame 57/67]
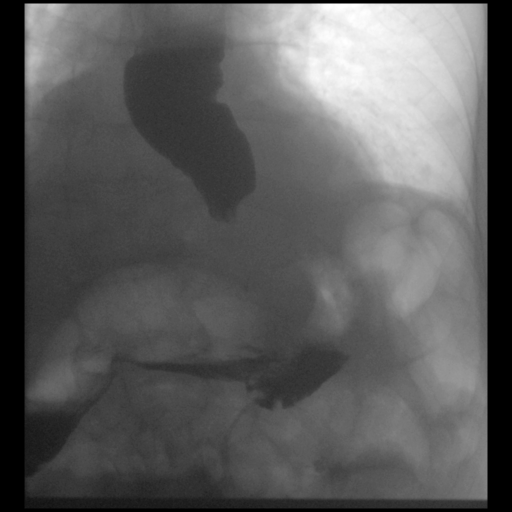

[Series 5: fluoro_iodine 2fps_bw · 0.19mm/px · 1 of 1 slices shown (3 of 7)]
[im 1/1]
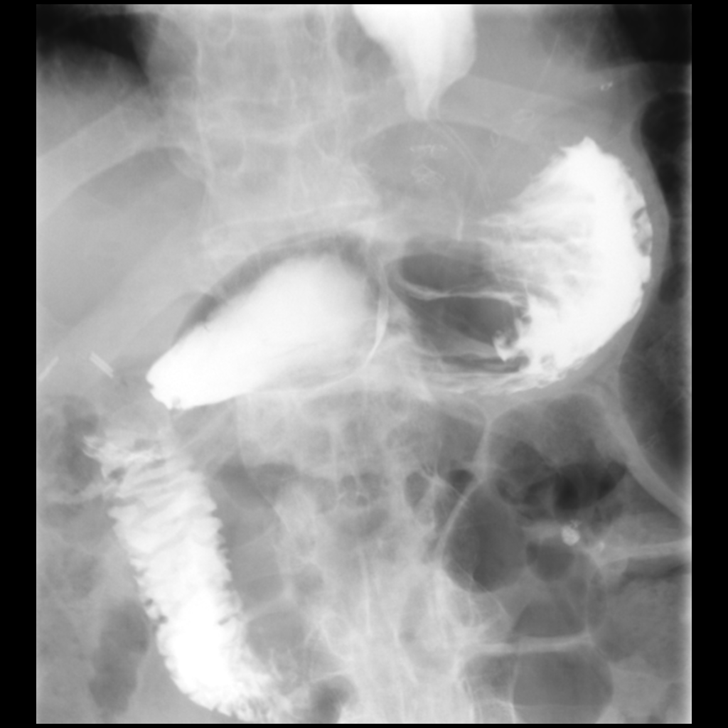

[Series 6: fluoro_iodine 2fps_bw · 0.19mm/px · 1 of 1 slices shown (4 of 7)]
[im 1/1]
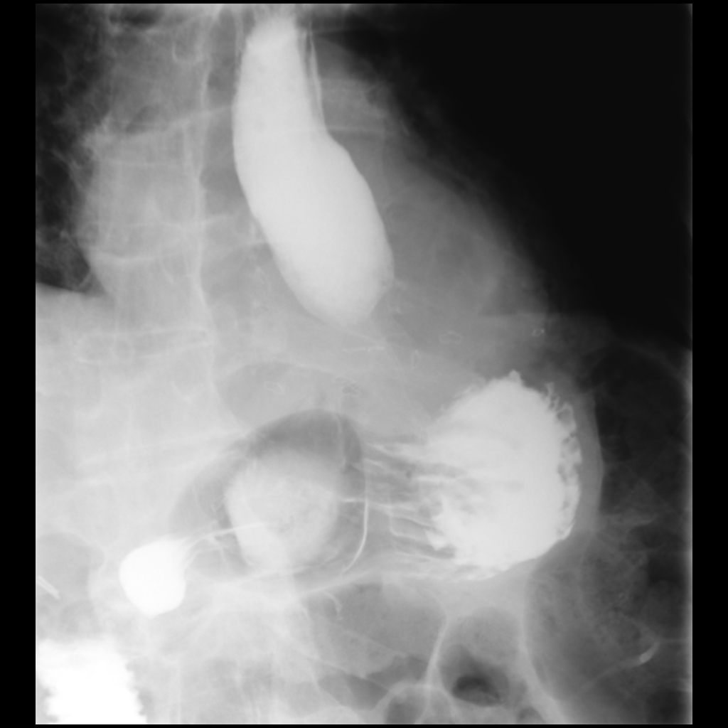

[Series 7: fluoro_iodine 2fps_bw · 0.19mm/px · 1 of 1 slices shown (5 of 7)]
[im 1/1]
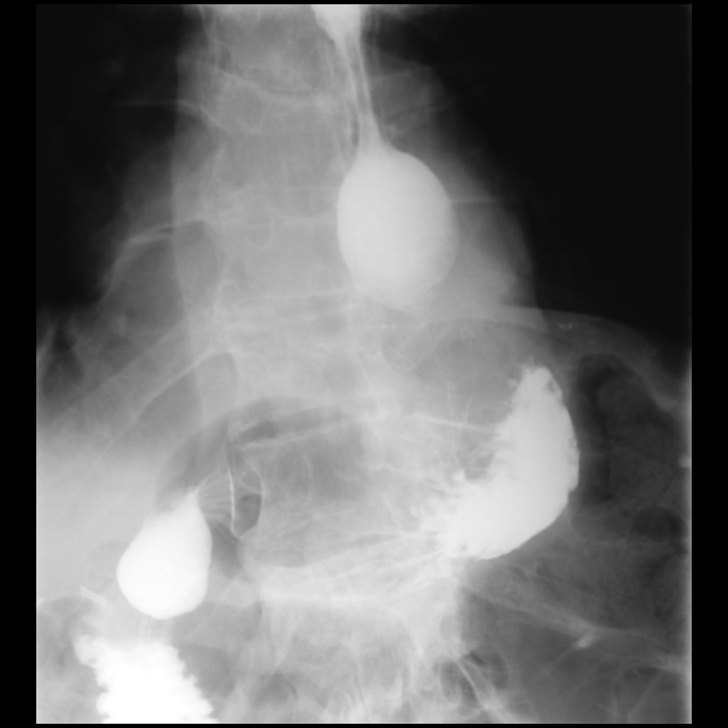

[Series 9: fluoro_iodine 2fps_bw · 0.19mm/px · 1 of 1 slices shown (6 of 7)]
[im 1/1]
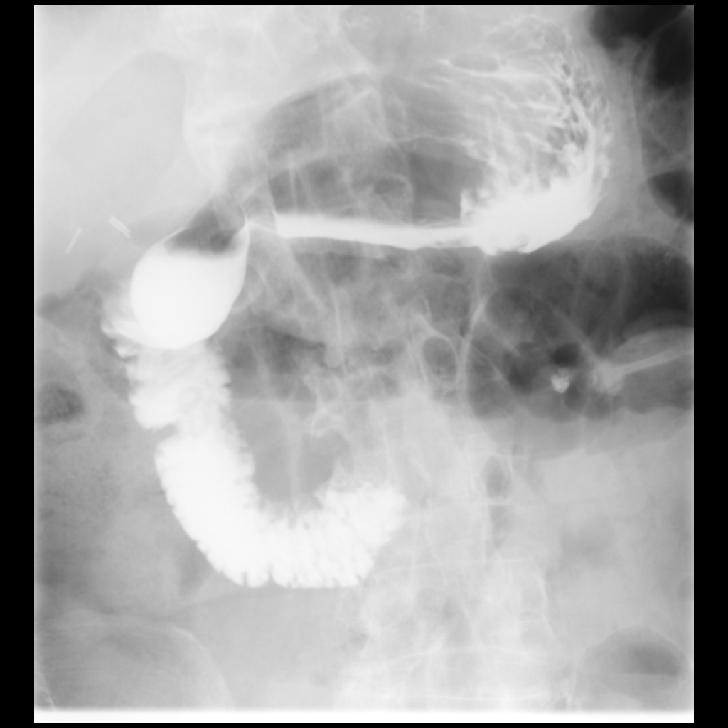

[Series 10: fluoro_iodine 2fps_bw · 0.19mm/px · 1 of 1 slices shown (7 of 7)]
[im 1/1]
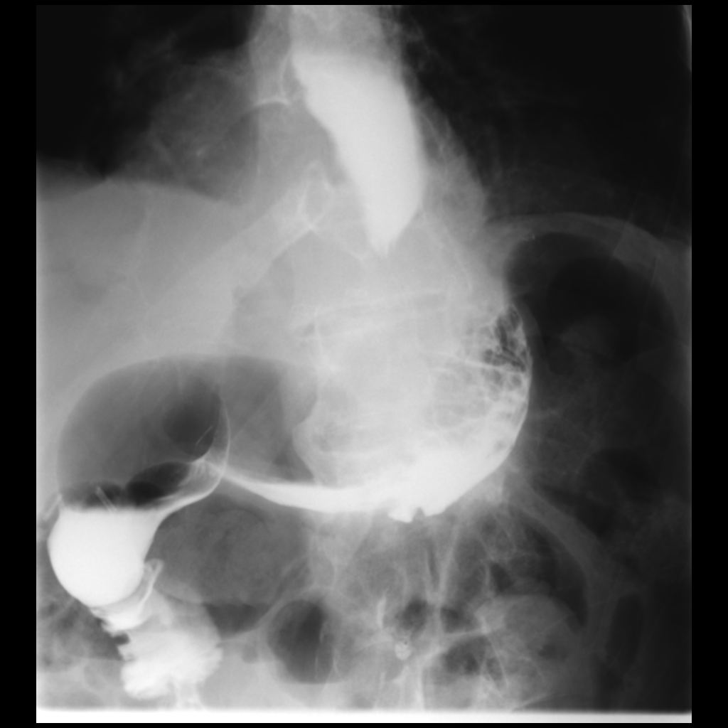

[13 of 16 positions shown; findings below may reference images not displayed]

FINDINGS: Esophagus is moderately dilated diffusely. Decreased esophageal
motility. There is smooth narrowing at the GE junction over
approximately 5 cm. Contrast intermittently pass through this area
of narrowing into the stomach however there remains considerable
contrast in the esophagus in the upright position. This is most
likely due to postop edema from hiatal hernia repair.

Negative for leak

Stomach fills with contrast and empties normally.
IMPRESSION: Postop [place]man fundoplication. There is edema at the GE junction with
slow emptying of the esophagus into the stomach. Esophagus diffusely
dilated with decreased peristalsis.

Negative for leak.

## 2018-08-27 DIAGNOSIS — G500 Trigeminal neuralgia: Secondary | ICD-10-CM | POA: Diagnosis not present

## 2018-08-27 DIAGNOSIS — Z510 Encounter for antineoplastic radiation therapy: Secondary | ICD-10-CM | POA: Diagnosis not present

## 2018-09-13 DIAGNOSIS — Z125 Encounter for screening for malignant neoplasm of prostate: Secondary | ICD-10-CM | POA: Diagnosis not present

## 2018-09-13 DIAGNOSIS — E7849 Other hyperlipidemia: Secondary | ICD-10-CM | POA: Diagnosis not present

## 2018-09-13 DIAGNOSIS — I10 Essential (primary) hypertension: Secondary | ICD-10-CM | POA: Diagnosis not present

## 2018-09-13 DIAGNOSIS — R82998 Other abnormal findings in urine: Secondary | ICD-10-CM | POA: Diagnosis not present

## 2018-09-13 DIAGNOSIS — M109 Gout, unspecified: Secondary | ICD-10-CM | POA: Diagnosis not present

## 2018-09-13 DIAGNOSIS — E538 Deficiency of other specified B group vitamins: Secondary | ICD-10-CM | POA: Diagnosis not present

## 2018-09-13 DIAGNOSIS — R7301 Impaired fasting glucose: Secondary | ICD-10-CM | POA: Diagnosis not present

## 2018-09-13 DIAGNOSIS — M810 Age-related osteoporosis without current pathological fracture: Secondary | ICD-10-CM | POA: Diagnosis not present

## 2018-09-20 DIAGNOSIS — T1490XA Injury, unspecified, initial encounter: Secondary | ICD-10-CM | POA: Diagnosis not present

## 2018-09-20 DIAGNOSIS — R42 Dizziness and giddiness: Secondary | ICD-10-CM | POA: Diagnosis not present

## 2018-09-20 DIAGNOSIS — I6389 Other cerebral infarction: Secondary | ICD-10-CM | POA: Diagnosis not present

## 2018-09-20 DIAGNOSIS — N401 Enlarged prostate with lower urinary tract symptoms: Secondary | ICD-10-CM | POA: Diagnosis not present

## 2018-09-20 DIAGNOSIS — I2510 Atherosclerotic heart disease of native coronary artery without angina pectoris: Secondary | ICD-10-CM | POA: Diagnosis not present

## 2018-09-20 DIAGNOSIS — Z1331 Encounter for screening for depression: Secondary | ICD-10-CM | POA: Diagnosis not present

## 2018-09-20 DIAGNOSIS — G500 Trigeminal neuralgia: Secondary | ICD-10-CM | POA: Diagnosis not present

## 2018-09-20 DIAGNOSIS — I7090 Unspecified atherosclerosis: Secondary | ICD-10-CM | POA: Diagnosis not present

## 2018-09-20 DIAGNOSIS — Z0000 Encounter for general adult medical examination without abnormal findings: Secondary | ICD-10-CM | POA: Diagnosis not present

## 2018-09-20 DIAGNOSIS — M109 Gout, unspecified: Secondary | ICD-10-CM | POA: Diagnosis not present

## 2018-09-20 DIAGNOSIS — E669 Obesity, unspecified: Secondary | ICD-10-CM | POA: Diagnosis not present

## 2018-09-29 ENCOUNTER — Other Ambulatory Visit (HOSPITAL_COMMUNITY): Payer: Self-pay | Admitting: *Deleted

## 2018-09-30 ENCOUNTER — Other Ambulatory Visit: Payer: Self-pay

## 2018-09-30 ENCOUNTER — Ambulatory Visit (HOSPITAL_COMMUNITY)
Admission: RE | Admit: 2018-09-30 | Discharge: 2018-09-30 | Disposition: A | Discharge status: Home / Self Care | Payer: PPO | Source: Ambulatory Visit | Attending: Internal Medicine | Admitting: Internal Medicine

## 2018-09-30 DIAGNOSIS — M810 Age-related osteoporosis without current pathological fracture: Secondary | ICD-10-CM | POA: Diagnosis not present

## 2018-09-30 MED ORDER — ZOLEDRONIC ACID 5 MG/100ML IV SOLN
5.0000 mg | Freq: Once | INTRAVENOUS | Status: AC
  Administered 2018-09-30: 5 mg via INTRAVENOUS

## 2018-09-30 MED ORDER — ZOLEDRONIC ACID 5 MG/100ML IV SOLN
INTRAVENOUS | Status: AC
  Administered 2018-09-30: 5 mg via INTRAVENOUS
  Filled 2018-09-30: qty 100

## 2018-09-30 NOTE — Discharge Instructions (Signed)

## 2019-02-08 DIAGNOSIS — Z79899 Other long term (current) drug therapy: Secondary | ICD-10-CM | POA: Diagnosis not present

## 2019-02-08 DIAGNOSIS — G500 Trigeminal neuralgia: Secondary | ICD-10-CM | POA: Diagnosis not present

## 2019-03-07 DIAGNOSIS — S42025A Nondisplaced fracture of shaft of left clavicle, initial encounter for closed fracture: Secondary | ICD-10-CM | POA: Diagnosis not present

## 2019-03-18 DIAGNOSIS — I10 Essential (primary) hypertension: Secondary | ICD-10-CM | POA: Diagnosis not present

## 2019-03-18 DIAGNOSIS — M810 Age-related osteoporosis without current pathological fracture: Secondary | ICD-10-CM | POA: Diagnosis not present

## 2019-03-18 DIAGNOSIS — I2510 Atherosclerotic heart disease of native coronary artery without angina pectoris: Secondary | ICD-10-CM | POA: Diagnosis not present

## 2019-03-18 DIAGNOSIS — K5090 Crohn's disease, unspecified, without complications: Secondary | ICD-10-CM | POA: Diagnosis not present

## 2019-03-18 DIAGNOSIS — N401 Enlarged prostate with lower urinary tract symptoms: Secondary | ICD-10-CM | POA: Diagnosis not present

## 2019-03-18 DIAGNOSIS — I6389 Other cerebral infarction: Secondary | ICD-10-CM | POA: Diagnosis not present

## 2019-03-18 DIAGNOSIS — M549 Dorsalgia, unspecified: Secondary | ICD-10-CM | POA: Diagnosis not present

## 2019-03-18 DIAGNOSIS — E538 Deficiency of other specified B group vitamins: Secondary | ICD-10-CM | POA: Diagnosis not present

## 2019-03-18 DIAGNOSIS — M4840XS Fatigue fracture of vertebra, site unspecified, sequela of fracture: Secondary | ICD-10-CM | POA: Diagnosis not present

## 2019-03-18 DIAGNOSIS — G500 Trigeminal neuralgia: Secondary | ICD-10-CM | POA: Diagnosis not present

## 2019-03-18 DIAGNOSIS — M109 Gout, unspecified: Secondary | ICD-10-CM | POA: Diagnosis not present

## 2019-04-26 DIAGNOSIS — S42025D Nondisplaced fracture of shaft of left clavicle, subsequent encounter for fracture with routine healing: Secondary | ICD-10-CM | POA: Diagnosis not present

## 2019-05-18 DIAGNOSIS — G500 Trigeminal neuralgia: Secondary | ICD-10-CM | POA: Diagnosis not present

## 2019-05-18 DIAGNOSIS — Z923 Personal history of irradiation: Secondary | ICD-10-CM | POA: Diagnosis not present

## 2019-07-12 DIAGNOSIS — H2513 Age-related nuclear cataract, bilateral: Secondary | ICD-10-CM | POA: Diagnosis not present

## 2019-07-12 DIAGNOSIS — H31091 Other chorioretinal scars, right eye: Secondary | ICD-10-CM | POA: Diagnosis not present

## 2019-07-12 DIAGNOSIS — H40013 Open angle with borderline findings, low risk, bilateral: Secondary | ICD-10-CM | POA: Diagnosis not present

## 2019-07-12 DIAGNOSIS — H02834 Dermatochalasis of left upper eyelid: Secondary | ICD-10-CM | POA: Diagnosis not present

## 2019-07-12 DIAGNOSIS — H02831 Dermatochalasis of right upper eyelid: Secondary | ICD-10-CM | POA: Diagnosis not present

## 2019-07-21 DIAGNOSIS — H2512 Age-related nuclear cataract, left eye: Secondary | ICD-10-CM | POA: Diagnosis not present

## 2019-07-21 DIAGNOSIS — H2511 Age-related nuclear cataract, right eye: Secondary | ICD-10-CM | POA: Diagnosis not present

## 2019-08-18 DIAGNOSIS — H2511 Age-related nuclear cataract, right eye: Secondary | ICD-10-CM | POA: Diagnosis not present

## 2019-10-17 DIAGNOSIS — Z125 Encounter for screening for malignant neoplasm of prostate: Secondary | ICD-10-CM | POA: Diagnosis not present

## 2019-10-17 DIAGNOSIS — M109 Gout, unspecified: Secondary | ICD-10-CM | POA: Diagnosis not present

## 2019-10-17 DIAGNOSIS — E538 Deficiency of other specified B group vitamins: Secondary | ICD-10-CM | POA: Diagnosis not present

## 2019-10-17 DIAGNOSIS — R7301 Impaired fasting glucose: Secondary | ICD-10-CM | POA: Diagnosis not present

## 2019-10-17 DIAGNOSIS — E7849 Other hyperlipidemia: Secondary | ICD-10-CM | POA: Diagnosis not present

## 2019-10-17 DIAGNOSIS — M810 Age-related osteoporosis without current pathological fracture: Secondary | ICD-10-CM | POA: Diagnosis not present

## 2019-10-24 DIAGNOSIS — I10 Essential (primary) hypertension: Secondary | ICD-10-CM | POA: Diagnosis not present

## 2019-10-24 DIAGNOSIS — N401 Enlarged prostate with lower urinary tract symptoms: Secondary | ICD-10-CM | POA: Diagnosis not present

## 2019-10-24 DIAGNOSIS — M4840XS Fatigue fracture of vertebra, site unspecified, sequela of fracture: Secondary | ICD-10-CM | POA: Diagnosis not present

## 2019-10-24 DIAGNOSIS — I2510 Atherosclerotic heart disease of native coronary artery without angina pectoris: Secondary | ICD-10-CM | POA: Diagnosis not present

## 2019-10-24 DIAGNOSIS — I7090 Unspecified atherosclerosis: Secondary | ICD-10-CM | POA: Diagnosis not present

## 2019-10-24 DIAGNOSIS — E669 Obesity, unspecified: Secondary | ICD-10-CM | POA: Diagnosis not present

## 2019-10-24 DIAGNOSIS — G720 Drug-induced myopathy: Secondary | ICD-10-CM | POA: Diagnosis not present

## 2019-10-24 DIAGNOSIS — Z1331 Encounter for screening for depression: Secondary | ICD-10-CM | POA: Diagnosis not present

## 2019-10-24 DIAGNOSIS — D649 Anemia, unspecified: Secondary | ICD-10-CM | POA: Diagnosis not present

## 2019-10-24 DIAGNOSIS — Z0000 Encounter for general adult medical examination without abnormal findings: Secondary | ICD-10-CM | POA: Diagnosis not present

## 2019-10-24 DIAGNOSIS — M810 Age-related osteoporosis without current pathological fracture: Secondary | ICD-10-CM | POA: Diagnosis not present

## 2019-10-24 DIAGNOSIS — M109 Gout, unspecified: Secondary | ICD-10-CM | POA: Diagnosis not present

## 2019-10-24 DIAGNOSIS — F1099 Alcohol use, unspecified with unspecified alcohol-induced disorder: Secondary | ICD-10-CM | POA: Diagnosis not present

## 2020-02-06 DIAGNOSIS — H40013 Open angle with borderline findings, low risk, bilateral: Secondary | ICD-10-CM | POA: Diagnosis not present

## 2020-02-06 DIAGNOSIS — Z961 Presence of intraocular lens: Secondary | ICD-10-CM | POA: Diagnosis not present

## 2020-02-06 DIAGNOSIS — H02834 Dermatochalasis of left upper eyelid: Secondary | ICD-10-CM | POA: Diagnosis not present

## 2020-02-06 DIAGNOSIS — H02831 Dermatochalasis of right upper eyelid: Secondary | ICD-10-CM | POA: Diagnosis not present

## 2020-02-06 DIAGNOSIS — H31091 Other chorioretinal scars, right eye: Secondary | ICD-10-CM | POA: Diagnosis not present

## 2020-02-09 DIAGNOSIS — H903 Sensorineural hearing loss, bilateral: Secondary | ICD-10-CM | POA: Diagnosis not present

## 2020-04-09 DIAGNOSIS — Z20822 Contact with and (suspected) exposure to covid-19: Secondary | ICD-10-CM | POA: Diagnosis not present

## 2020-05-08 DIAGNOSIS — M810 Age-related osteoporosis without current pathological fracture: Secondary | ICD-10-CM | POA: Diagnosis not present

## 2020-05-08 DIAGNOSIS — E559 Vitamin D deficiency, unspecified: Secondary | ICD-10-CM | POA: Diagnosis not present

## 2020-05-08 DIAGNOSIS — M25559 Pain in unspecified hip: Secondary | ICD-10-CM | POA: Diagnosis not present

## 2020-09-25 ENCOUNTER — Encounter: Payer: Self-pay | Admitting: Internal Medicine

## 2020-11-07 DIAGNOSIS — E559 Vitamin D deficiency, unspecified: Secondary | ICD-10-CM | POA: Diagnosis not present

## 2020-11-07 DIAGNOSIS — M109 Gout, unspecified: Secondary | ICD-10-CM | POA: Diagnosis not present

## 2020-11-07 DIAGNOSIS — E785 Hyperlipidemia, unspecified: Secondary | ICD-10-CM | POA: Diagnosis not present

## 2020-11-07 DIAGNOSIS — E538 Deficiency of other specified B group vitamins: Secondary | ICD-10-CM | POA: Diagnosis not present

## 2020-11-07 DIAGNOSIS — Z125 Encounter for screening for malignant neoplasm of prostate: Secondary | ICD-10-CM | POA: Diagnosis not present

## 2020-11-07 DIAGNOSIS — R7301 Impaired fasting glucose: Secondary | ICD-10-CM | POA: Diagnosis not present

## 2020-11-09 DIAGNOSIS — F1010 Alcohol abuse, uncomplicated: Secondary | ICD-10-CM | POA: Diagnosis not present

## 2020-11-09 DIAGNOSIS — F3341 Major depressive disorder, recurrent, in partial remission: Secondary | ICD-10-CM | POA: Diagnosis not present

## 2020-11-09 DIAGNOSIS — Z79891 Long term (current) use of opiate analgesic: Secondary | ICD-10-CM | POA: Diagnosis not present

## 2020-11-14 DIAGNOSIS — M797 Fibromyalgia: Secondary | ICD-10-CM | POA: Diagnosis not present

## 2020-11-14 DIAGNOSIS — F329 Major depressive disorder, single episode, unspecified: Secondary | ICD-10-CM | POA: Diagnosis not present

## 2020-11-14 DIAGNOSIS — G379 Demyelinating disease of central nervous system, unspecified: Secondary | ICD-10-CM | POA: Diagnosis not present

## 2020-11-14 DIAGNOSIS — I7090 Unspecified atherosclerosis: Secondary | ICD-10-CM | POA: Diagnosis not present

## 2020-11-14 DIAGNOSIS — N1831 Chronic kidney disease, stage 3a: Secondary | ICD-10-CM | POA: Diagnosis not present

## 2020-11-14 DIAGNOSIS — I129 Hypertensive chronic kidney disease with stage 1 through stage 4 chronic kidney disease, or unspecified chronic kidney disease: Secondary | ICD-10-CM | POA: Diagnosis not present

## 2020-11-14 DIAGNOSIS — K5090 Crohn's disease, unspecified, without complications: Secondary | ICD-10-CM | POA: Diagnosis not present

## 2020-11-14 DIAGNOSIS — R82998 Other abnormal findings in urine: Secondary | ICD-10-CM | POA: Diagnosis not present

## 2020-11-14 DIAGNOSIS — Z0000 Encounter for general adult medical examination without abnormal findings: Secondary | ICD-10-CM | POA: Diagnosis not present

## 2020-11-14 DIAGNOSIS — G720 Drug-induced myopathy: Secondary | ICD-10-CM | POA: Diagnosis not present

## 2020-11-14 DIAGNOSIS — M4840XS Fatigue fracture of vertebra, site unspecified, sequela of fracture: Secondary | ICD-10-CM | POA: Diagnosis not present

## 2020-11-14 DIAGNOSIS — Z1331 Encounter for screening for depression: Secondary | ICD-10-CM | POA: Diagnosis not present

## 2020-11-14 DIAGNOSIS — I2510 Atherosclerotic heart disease of native coronary artery without angina pectoris: Secondary | ICD-10-CM | POA: Diagnosis not present

## 2020-11-14 DIAGNOSIS — E785 Hyperlipidemia, unspecified: Secondary | ICD-10-CM | POA: Diagnosis not present

## 2020-11-22 ENCOUNTER — Other Ambulatory Visit: Payer: Self-pay

## 2020-11-22 ENCOUNTER — Ambulatory Visit (AMBULATORY_SURGERY_CENTER): Payer: Self-pay | Admitting: *Deleted

## 2020-11-22 VITALS — Ht 69.0 in | Wt 210.0 lb

## 2020-11-22 DIAGNOSIS — K5010 Crohn's disease of large intestine without complications: Secondary | ICD-10-CM

## 2020-11-22 MED ORDER — SUTAB 1479-225-188 MG PO TABS: 24.0000 | ORAL_TABLET | ORAL | 0 refills | Status: DC

## 2020-11-22 NOTE — Progress Notes (Signed)
No egg or soy allergy known to patient  No issues with past sedation with any surgeries or procedures Patient denies ever being told they had issues or difficulty with intubation  No FH of Malignant Hyperthermia No diet pills per patient No home 02 use per patient  No blood thinners per patient  Pt denies issues with constipation  No A fib or A flutter  EMMI video to pt or via [place] 19 guidelines implemented in [place] today with Pt and RN  Pt is fully vaccinated  for [employer] given to pt in PV today , Code to Pharmacy and  NO PA's for preps discussed with pt In PV today  Discussed with pt there will be an out-of-pocket cost for prep and that varies from $0 to 70 dollars   Due to the COVID-19 pandemic we are asking patients to follow certain guidelines.  Pt aware of COVID protocols and [facility] guidelines   Pt states in PV he has been having constipation alternating with Diarrhea and he is concerned- he tried Metamucil, thinking this would help and it did not so he has stopped that

## 2020-11-30 ENCOUNTER — Other Ambulatory Visit (HOSPITAL_COMMUNITY): Payer: Self-pay | Admitting: *Deleted

## 2020-12-03 ENCOUNTER — Inpatient Hospital Stay (HOSPITAL_COMMUNITY): Admission: RE | Admit: 2020-12-03 | Payer: PPO | Source: Ambulatory Visit

## 2020-12-04 DIAGNOSIS — F3341 Major depressive disorder, recurrent, in partial remission: Secondary | ICD-10-CM | POA: Diagnosis not present

## 2020-12-04 DIAGNOSIS — F1010 Alcohol abuse, uncomplicated: Secondary | ICD-10-CM | POA: Diagnosis not present

## 2020-12-07 ENCOUNTER — Other Ambulatory Visit: Payer: Self-pay

## 2020-12-07 ENCOUNTER — Ambulatory Visit (AMBULATORY_SURGERY_CENTER): Payer: PPO | Admitting: Internal Medicine

## 2020-12-07 ENCOUNTER — Encounter: Payer: Self-pay | Admitting: Internal Medicine

## 2020-12-07 VITALS — BP 126/75 | HR 65 | Temp 98.0°F | Resp 14 | Ht 69.0 in | Wt 210.0 lb

## 2020-12-07 DIAGNOSIS — K5010 Crohn's disease of large intestine without complications: Secondary | ICD-10-CM | POA: Diagnosis not present

## 2020-12-07 DIAGNOSIS — K635 Polyp of colon: Secondary | ICD-10-CM

## 2020-12-07 DIAGNOSIS — M797 Fibromyalgia: Secondary | ICD-10-CM | POA: Diagnosis not present

## 2020-12-07 DIAGNOSIS — K5090 Crohn's disease, unspecified, without complications: Secondary | ICD-10-CM | POA: Diagnosis not present

## 2020-12-07 DIAGNOSIS — D124 Benign neoplasm of descending colon: Secondary | ICD-10-CM | POA: Diagnosis not present

## 2020-12-07 DIAGNOSIS — I10 Essential (primary) hypertension: Secondary | ICD-10-CM | POA: Diagnosis not present

## 2020-12-07 HISTORY — PX: COLONOSCOPY: SHX174

## 2020-12-07 MED ORDER — SODIUM CHLORIDE 0.9 % IV SOLN: 500.0000 mL | Freq: Once | INTRAVENOUS | Status: DC

## 2020-12-07 NOTE — Op Note (Signed)
[place] Patient Name: Alexander Lambert Procedure Date: 12/07/2020 3:12 PM MRN: [id number] Endoscopist: Docia Chuck. Henrene Pastor , MD Age: 75 Referring MD:  Date of Birth: 01-Jan-1946 Gender: Male Account #: [id number] Procedure:                Colonoscopy with cold snare polypectomy x 1 Indications:              High risk colon cancer surveillance: Crohn's                            colitis of 8 (or more) years duration with                            one-third (or more) of the colon involved. Last                            examination 2017 Medicines:                Monitored Anesthesia Care Procedure:                Pre-Anesthesia Assessment:                           - Prior to the procedure, a History and Physical                            was performed, and patient medications and                            allergies were reviewed. The patient's tolerance of                            previous anesthesia was also reviewed. The risks                            and benefits of the procedure and the sedation                            options and risks were discussed with the patient.                            All questions were answered, and informed consent                            was obtained. Prior Anticoagulants: The patient has                            taken no previous anticoagulant or antiplatelet                            agents. ASA Grade Assessment: II - A patient with                            mild systemic disease. After reviewing the risks  and benefits, the patient was deemed in                            satisfactory condition to undergo the procedure.                           After obtaining informed consent, the colonoscope                            was passed under direct vision. Throughout the                            procedure, the patient's blood pressure, pulse, and                            oxygen saturations were monitored  continuously. The                            Colonoscope was introduced through the anus and                            advanced to the the cecum, identified by                            appendiceal orifice and ileocecal valve. The                            ileocecal valve, appendiceal orifice, and rectum                            were photographed. The quality of the bowel                            preparation was good. The colonoscopy was performed                            without difficulty. The patient tolerated the                            procedure well. The bowel preparation used was                            SUPREP via split dose instruction. Scope In: 3:20:21 PM Scope Out: 3:42:38 PM Scope Withdrawal Time: 0 hours 9 minutes 7 seconds  Total Procedure Duration: 0 hours 22 minutes 17 seconds  Findings:                 A 3 mm polyp was found in the descending colon. The                            polyp was sessile. The polyp was removed with a                            cold snare. Resection and retrieval were  complete.                           Many small and large-mouthed diverticula were found                            in the entire colon.                           The exam was otherwise without abnormality on                            direct and retroflexion views. Complications:            No immediate complications. Estimated blood loss:                            None. Estimated Blood Loss:     Estimated blood loss: none. Impression:               - One 3 mm polyp in the descending colon, removed                            with a cold snare. Resected and retrieved.                           - Diverticulosis in the entire examined colon.                           - The examination was otherwise normal on direct                            and retroflexion views. Recommendation:           - Repeat colonoscopy is not recommended for                             surveillance.                           - Patient has a contact number available for                            emergencies. The signs and symptoms of potential                            delayed complications were discussed with the                            patient. Return to normal activities tomorrow.                            Written discharge instructions were provided to the                            patient.                           -  Resume previous diet.                           - Continue present medications.                           - Await pathology results.                           -Recommend MIRALAX (GlycoLax). Take 1 dose (17 g)                            in 8 to 10 ounces of water daily. May increase or                            decrease this daily dosage to achieve the desired                            result Docia Chuck. Henrene Pastor, MD 12/07/2020 3:51:25 PM This report has been signed electronically.

## 2020-12-07 NOTE — Progress Notes (Signed)
Called to room to assist during endoscopic procedure.  Patient ID and intended procedure confirmed with present staff. Received instructions for my participation in the procedure from the performing physician.  

## 2020-12-07 NOTE — Progress Notes (Signed)
Report to PACU, RN, vss, BBS= Clear.  

## 2020-12-07 NOTE — Patient Instructions (Signed)
Read all of the handouts given to you by your recovery room nurse.  Take your miralax as ordered.  OU HAD AN ENDOSCOPIC PROCEDURE TODAY AT [facility]:   Refer to the procedure report that was given to you for any specific questions about what was found during the examination.  If the procedure report does not answer your questions, please call your gastroenterologist to clarify.  If you requested that your care partner not be given the details of your procedure findings, then the procedure report has been included in a sealed envelope for you to review at your convenience later.  YOU SHOULD EXPECT: Some feelings of bloating in the abdomen. Passage of more gas than usual.  Walking can help get rid of the air that was put into your GI tract during the procedure and reduce the bloating. If you had a lower endoscopy (such as a colonoscopy or flexible sigmoidoscopy) you may notice spotting of blood in your stool or on the toilet paper. If you underwent a bowel prep for your procedure, you may not have a normal bowel movement for a few days.  Please Note:  You might notice some irritation and congestion in your nose or some drainage.  This is from the oxygen used during your procedure.  There is no need for concern and it should clear up in a day or so.  SYMPTOMS TO REPORT IMMEDIATELY:  Following lower endoscopy (colonoscopy or flexible sigmoidoscopy):  Excessive amounts of blood in the stool  Significant tenderness or worsening of abdominal pains  Swelling of the abdomen that is new, acute  Fever of 100F or higher   For urgent or emergent issues, a gastroenterologist can be reached at any hour by calling [phone]. Do not use MyChart messaging for urgent concerns.    DIET:  We do recommend a small meal at first, but then you may proceed to your regular diet.  Drink plenty of fluids but you should avoid alcoholic beverages for 24 hours.  We recommend miralax 1 dose 17 gms in  8-10 oz of water. May decrease if diarrhea.  ACTIVITY:  You should plan to take it easy for the rest of today and you should NOT DRIVE or use heavy machinery until tomorrow (because of the sedation medicines used during the test).    FOLLOW UP: Our staff will call the number listed on your records 48-72 hours following your procedure to check on you and address any questions or concerns that you may have regarding the information given to you following your procedure. If we do not reach you, we will leave a message.  We will attempt to reach you two times.  During this call, we will ask if you have developed any symptoms of COVID 19. If you develop any symptoms (ie: fever, flu-like symptoms, shortness of breath, cough etc.) before then, please call [phone].  If you test positive for Covid 19 in the 2 weeks post procedure, please call and report this information to [place].    If any biopsies were taken you will be contacted by phone or by letter within the next 1-3 weeks.  Please call us at [phone] if you have not heard about the biopsies in 3 weeks.    SIGNATURES/CONFIDENTIALITY: You and/or your care partner have signed paperwork which will be entered into your electronic medical record.  These signatures attest to the fact that that the information above on your After Visit Summary has been reviewed and  is understood.  Full responsibility of the confidentiality of this discharge information lies with you and/or your care-partner.

## 2020-12-07 NOTE — Progress Notes (Signed)
Pt's states no medical or surgical changes since previsit or office visit.  ° °Vitals CW °

## 2020-12-11 ENCOUNTER — Telehealth: Payer: Self-pay | Admitting: *Deleted

## 2020-12-11 NOTE — Telephone Encounter (Signed)
  Follow up Call-  Call back number 12/07/2020  Post procedure Call Back phone  # [phone]  Permission to leave phone message Yes  Some recent data might be hidden     Patient questions:  Do you have a fever, pain , or abdominal swelling? No. Pain Score  0 *  Have you tolerated food without any problems? Yes.    Have you been able to return to your normal activities? Yes.    Do you have any questions about your discharge instructions: Diet   No. Medications  No. Follow up visit  No.  Do you have questions or concerns about your Care? No.  Actions: * If pain score is 4 or above: No action needed, pain <4.  Have you developed a fever since your procedure? no  2.   Have you had an respiratory symptoms (SOB or cough) since your procedure? no  3.   Have you tested positive for COVID 19 since your procedure no  4.   Have you had any family members/close contacts diagnosed with the COVID 19 since your procedure?  no   If yes to any of these questions please route to Joylene Kyandre, RN and Joella Prince, RN

## 2020-12-14 ENCOUNTER — Encounter: Payer: Self-pay | Admitting: Internal Medicine

## 2021-01-01 DIAGNOSIS — F1010 Alcohol abuse, uncomplicated: Secondary | ICD-10-CM | POA: Diagnosis not present

## 2021-01-01 DIAGNOSIS — F3341 Major depressive disorder, recurrent, in partial remission: Secondary | ICD-10-CM | POA: Diagnosis not present

## 2021-02-12 DIAGNOSIS — Z961 Presence of intraocular lens: Secondary | ICD-10-CM | POA: Diagnosis not present

## 2021-02-12 DIAGNOSIS — H02834 Dermatochalasis of left upper eyelid: Secondary | ICD-10-CM | POA: Diagnosis not present

## 2021-02-12 DIAGNOSIS — H31091 Other chorioretinal scars, right eye: Secondary | ICD-10-CM | POA: Diagnosis not present

## 2021-02-12 DIAGNOSIS — H40013 Open angle with borderline findings, low risk, bilateral: Secondary | ICD-10-CM | POA: Diagnosis not present

## 2021-02-12 DIAGNOSIS — H02831 Dermatochalasis of right upper eyelid: Secondary | ICD-10-CM | POA: Diagnosis not present

## 2021-03-05 DIAGNOSIS — I2510 Atherosclerotic heart disease of native coronary artery without angina pectoris: Secondary | ICD-10-CM | POA: Diagnosis not present

## 2021-03-05 DIAGNOSIS — G379 Demyelinating disease of central nervous system, unspecified: Secondary | ICD-10-CM | POA: Diagnosis not present

## 2021-03-05 DIAGNOSIS — J069 Acute upper respiratory infection, unspecified: Secondary | ICD-10-CM | POA: Diagnosis not present

## 2021-03-05 DIAGNOSIS — N1831 Chronic kidney disease, stage 3a: Secondary | ICD-10-CM | POA: Diagnosis not present

## 2021-03-05 DIAGNOSIS — R051 Acute cough: Secondary | ICD-10-CM | POA: Diagnosis not present

## 2021-03-05 DIAGNOSIS — I129 Hypertensive chronic kidney disease with stage 1 through stage 4 chronic kidney disease, or unspecified chronic kidney disease: Secondary | ICD-10-CM | POA: Diagnosis not present

## 2021-06-26 DIAGNOSIS — I6389 Other cerebral infarction: Secondary | ICD-10-CM | POA: Diagnosis not present

## 2021-06-26 DIAGNOSIS — R1310 Dysphagia, unspecified: Secondary | ICD-10-CM | POA: Diagnosis not present

## 2021-06-26 DIAGNOSIS — I7090 Unspecified atherosclerosis: Secondary | ICD-10-CM | POA: Diagnosis not present

## 2021-06-26 DIAGNOSIS — R7301 Impaired fasting glucose: Secondary | ICD-10-CM | POA: Diagnosis not present

## 2021-06-26 DIAGNOSIS — E785 Hyperlipidemia, unspecified: Secondary | ICD-10-CM | POA: Diagnosis not present

## 2021-06-26 DIAGNOSIS — I7300 Raynaud's syndrome without gangrene: Secondary | ICD-10-CM | POA: Diagnosis not present

## 2021-06-26 DIAGNOSIS — N1831 Chronic kidney disease, stage 3a: Secondary | ICD-10-CM | POA: Diagnosis not present

## 2021-06-26 DIAGNOSIS — I10 Essential (primary) hypertension: Secondary | ICD-10-CM | POA: Diagnosis not present

## 2021-06-26 DIAGNOSIS — M810 Age-related osteoporosis without current pathological fracture: Secondary | ICD-10-CM | POA: Diagnosis not present

## 2021-06-26 DIAGNOSIS — F329 Major depressive disorder, single episode, unspecified: Secondary | ICD-10-CM | POA: Diagnosis not present

## 2021-06-26 DIAGNOSIS — I2510 Atherosclerotic heart disease of native coronary artery without angina pectoris: Secondary | ICD-10-CM | POA: Diagnosis not present

## 2021-06-28 DIAGNOSIS — F331 Major depressive disorder, recurrent, moderate: Secondary | ICD-10-CM | POA: Diagnosis not present

## 2021-06-28 DIAGNOSIS — F1010 Alcohol abuse, uncomplicated: Secondary | ICD-10-CM | POA: Diagnosis not present

## 2021-07-16 DIAGNOSIS — C4441 Basal cell carcinoma of skin of scalp and neck: Secondary | ICD-10-CM | POA: Diagnosis not present

## 2021-07-16 DIAGNOSIS — L812 Freckles: Secondary | ICD-10-CM | POA: Diagnosis not present

## 2021-07-16 DIAGNOSIS — L821 Other seborrheic keratosis: Secondary | ICD-10-CM | POA: Diagnosis not present

## 2021-07-16 DIAGNOSIS — D485 Neoplasm of uncertain behavior of skin: Secondary | ICD-10-CM | POA: Diagnosis not present

## 2021-07-24 DIAGNOSIS — N1831 Chronic kidney disease, stage 3a: Secondary | ICD-10-CM | POA: Diagnosis not present

## 2021-07-24 DIAGNOSIS — F339 Major depressive disorder, recurrent, unspecified: Secondary | ICD-10-CM | POA: Diagnosis not present

## 2021-07-24 DIAGNOSIS — I129 Hypertensive chronic kidney disease with stage 1 through stage 4 chronic kidney disease, or unspecified chronic kidney disease: Secondary | ICD-10-CM | POA: Diagnosis not present

## 2021-07-24 DIAGNOSIS — Z87891 Personal history of nicotine dependence: Secondary | ICD-10-CM | POA: Diagnosis not present

## 2021-08-12 ENCOUNTER — Encounter: Payer: Self-pay | Admitting: Internal Medicine

## 2021-08-12 ENCOUNTER — Ambulatory Visit: Payer: PPO | Admitting: Internal Medicine

## 2021-08-12 VITALS — BP 102/64 | HR 90 | Ht 69.0 in | Wt 210.8 lb

## 2021-08-12 DIAGNOSIS — K5010 Crohn's disease of large intestine without complications: Secondary | ICD-10-CM | POA: Diagnosis not present

## 2021-08-12 DIAGNOSIS — R109 Unspecified abdominal pain: Secondary | ICD-10-CM

## 2021-08-12 DIAGNOSIS — R1310 Dysphagia, unspecified: Secondary | ICD-10-CM | POA: Diagnosis not present

## 2021-08-12 DIAGNOSIS — R112 Nausea with vomiting, unspecified: Secondary | ICD-10-CM

## 2021-08-12 NOTE — Patient Instructions (Signed)
If you are age 75 or older, your body mass index should be between 23-30. Your Body mass index is 31.13 kg/m?Marland Kitchen If this is out of the aforementioned range listed, please consider follow up with your Primary Care Provider. ? ?If you are age 20 or younger, your body mass index should be between 19-25. Your Body mass index is 31.13 kg/m?Marland Kitchen If this is out of the aformentioned range listed, please consider follow up with your Primary Care Provider.  ? ?________________________________________________________ ? ?The [facility] providers would like to encourage you to use [facility] to communicate with providers for non-urgent requests or questions.  Due to long hold times on the telephone, sending your provider a message by [facility] may be a faster and more efficient way to get a response.  Please allow 48 business hours for a response.  Please remember that this is for non-urgent requests.  ?_______________________________________________________ ? ?You have been scheduled for an endoscopy. Please follow written instructions given to you at your visit today. ?If you use inhalers (even only as needed), please bring them with you on the day of your procedure. ? ?
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
## 2021-08-14 DIAGNOSIS — Z85828 Personal history of other malignant neoplasm of skin: Secondary | ICD-10-CM | POA: Diagnosis not present

## 2021-08-14 DIAGNOSIS — C4441 Basal cell carcinoma of skin of scalp and neck: Secondary | ICD-10-CM | POA: Diagnosis not present

## 2021-08-29 ENCOUNTER — Encounter: Payer: Self-pay | Admitting: Internal Medicine

## 2021-08-29 ENCOUNTER — Ambulatory Visit (AMBULATORY_SURGERY_CENTER): Payer: PPO | Admitting: Internal Medicine

## 2021-08-29 VITALS — BP 117/63 | HR 58 | Temp 96.6°F | Resp 13 | Ht 69.0 in | Wt 210.0 lb

## 2021-08-29 DIAGNOSIS — K449 Diaphragmatic hernia without obstruction or gangrene: Secondary | ICD-10-CM

## 2021-08-29 DIAGNOSIS — K222 Esophageal obstruction: Secondary | ICD-10-CM | POA: Diagnosis not present

## 2021-08-29 DIAGNOSIS — R112 Nausea with vomiting, unspecified: Secondary | ICD-10-CM | POA: Diagnosis not present

## 2021-08-29 DIAGNOSIS — R1310 Dysphagia, unspecified: Secondary | ICD-10-CM | POA: Diagnosis not present

## 2021-08-29 DIAGNOSIS — R109 Unspecified abdominal pain: Secondary | ICD-10-CM | POA: Diagnosis not present

## 2021-08-29 MED ORDER — SODIUM CHLORIDE 0.9 % IV SOLN: 500.0000 mL | Freq: Once | INTRAVENOUS | Status: DC

## 2021-08-29 NOTE — Patient Instructions (Signed)
Nothing by mouth until 1:00 pm, clear liquids until 2:00 pm, soft foods for the rest of today. Tomorrow you may resume normal diet. ? ? ? ?YOU HAD AN ENDOSCOPIC PROCEDURE TODAY AT [facility]:   Refer to the procedure report that was given to you for any specific questions about what was found during the examination.  If the procedure report does not answer your questions, please call your gastroenterologist to clarify.  If you requested that your care partner not be given the details of your procedure findings, then the procedure report has been included in a sealed envelope for you to review at your convenience later. ? ?YOU SHOULD EXPECT: Some feelings of bloating in the abdomen. Passage of more gas than usual.  Walking can help get rid of the air that was put into your GI tract during the procedure and reduce the bloating. If you had a lower endoscopy (such as a colonoscopy or flexible sigmoidoscopy) you may notice spotting of blood in your stool or on the toilet paper. If you underwent a bowel prep for your procedure, you may not have a normal bowel movement for a few days. ? ?Please Note:  You might notice some irritation and congestion in your nose or some drainage.  This is from the oxygen used during your procedure.  There is no need for concern and it should clear up in a day or so. ? ?SYMPTOMS TO REPORT IMMEDIATELY: ? ? ?Following upper endoscopy (EGD) ? Vomiting of blood or coffee ground material ? New chest pain or pain under the shoulder blades ? Painful or persistently difficult swallowing ? New shortness of breath ? Fever of 100?F or higher ? Black, tarry-looking stools ? ?For urgent or emergent issues, a gastroenterologist can be reached at any hour by calling [phone]. ?Do not use MyChart messaging for urgent concerns.  ? ? ?DIET:  Nothing by mouth until 1:00, clear liquids until 2:00, soft foods for the rest of today.  Tomorrow you may proceed to your regular diet.  Drink  plenty of fluids but you should avoid alcoholic beverages for 24 hours. ? ?ACTIVITY:  You should plan to take it easy for the rest of today and you should NOT DRIVE or use heavy machinery until tomorrow (because of the sedation medicines used during the test).   ? ?FOLLOW UP: ?Our staff will call the number listed on your records 48-72 hours following your procedure to check on you and address any questions or concerns that you may have regarding the information given to you following your procedure. If we do not reach you, we will leave a message.  We will attempt to reach you two times.  During this call, we will ask if you have developed any symptoms of COVID 19. If you develop any symptoms (ie: fever, flu-like symptoms, shortness of breath, cough etc.) before then, please call [phone].  If you test positive for Covid 19 in the 2 weeks post procedure, please call and report this information to [place].   ? ?If any biopsies were taken you will be contacted by phone or by letter within the next 1-3 weeks.  Please call us at [phone] if you have not heard about the biopsies in 3 weeks.  ? ? ?SIGNATURES/CONFIDENTIALITY: ?You and/or your care partner have signed paperwork which will be entered into your electronic medical record.  These signatures attest to the fact that that the information above on your After Visit Summary has  been reviewed and is understood.  Full responsibility of the confidentiality of this discharge information lies with you and/or your care-partner.  ?

## 2021-08-29 NOTE — Progress Notes (Signed)
Called to room to assist during endoscopic procedure.  Patient ID and intended procedure confirmed with present staff. Received instructions for my participation in the procedure from the performing physician.  
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
## 2021-08-29 NOTE — Progress Notes (Signed)
VS completed by HF. ? ? ?Pt's states no medical or surgical changes since previsit or office visit. ? ?

## 2021-08-29 NOTE — Op Note (Signed)
[place] ?Patient Name: Alexander Lambert ?Procedure Date: 08/29/2021 11:20 AM ?MRN: [id number] ?Endoscopist: Docia Chuck. Henrene Pastor , MD ?Age: 76 ?Referring MD:  ?Date of Birth: Aug 14, 1945 ?Gender: Male ?Account #: [id number] ?Procedure:                Upper GI endoscopy with esophageal dilation the  ?                          balloon. 18, 19, 20 mm ?Indications:              Epigastric abdominal pain, Dysphagia, Nausea with  ?                          vomiting. Prior history of fundoplication ?Medicines:                Monitored Anesthesia Care ?Procedure:                Pre-Anesthesia Assessment: ?                          - Prior to the procedure, a History and Physical  ?                          was performed, and patient medications and  ?                          allergies were reviewed. The patient's tolerance of  ?                          previous anesthesia was also reviewed. The risks  ?                          and benefits of the procedure and the sedation  ?                          options and risks were discussed with the patient.  ?                          All questions were answered, and informed consent  ?                          was obtained. Prior Anticoagulants: The patient has  ?                          taken no previous anticoagulant or antiplatelet  ?                          agents. ASA Grade Assessment: II - A patient with  ?                          mild systemic disease. After reviewing the risks  ?                          and benefits, the patient was deemed in  ?  satisfactory condition to undergo the procedure. ?                          After obtaining informed consent, the endoscope was  ?                          passed under direct vision. Throughout the  ?                          procedure, the patient's blood pressure, pulse, and  ?                          oxygen saturations were monitored continuously. The  ?                          Endoscope was  introduced through the mouth, and  ?                          advanced to the second part of duodenum. The upper  ?                          GI endoscopy was accomplished without difficulty.  ?                          The patient tolerated the procedure well. ?Scope In: ?Scope Out: ?Findings:                 The esophagus revealed a benign ringlike stricture  ?                          at the gastroesophageal junction. After completing  ?                          the endoscopic survey, A TTS dilator was passed  ?                          through the scope. Dilation with an 18-19-20 mm  ?                          balloon dilator was performed to 20 mm. There was  ?                          scant heme. ?                          The stomach revealed evidence of prior  ?                          fundoplication which has slipped. Now with 3 cm  ?                          hiatal hernia. No complicated appearing features. ?                          The examined  duodenum was normal. ?                          The cardia and gastric fundus were normal on  ?                          retroflexion, save postoperative change and hiatal  ?                          hernia. ?Complications:            No immediate complications. ?Estimated Blood Loss:     Estimated blood loss: none. ?Impression:               - Normal esophagus. Dilated. ?                          - Normal stomach. ?                          - Normal examined duodenum. ?                          - No specimens collected. ?Recommendation:           1. Patient has a contact number available for  ?                          emergencies. The signs and symptoms of potential  ?                          delayed complications were discussed with the  ?                          patient. Return to normal activities tomorrow.  ?                          Written discharge instructions were provided to the  ?                          patient. ?                          2. Post  dilation diet. ?                          3. Continue present medications. ?                          4. Would consider resumption of PPI if acid reflux  ?                          symptoms, such as heartburn, occur ?                          5. Follow-up with Dr. Henrene Pastor as needed for recurrent  ?  problems ?Docia Chuck. Henrene Pastor, MD ?08/29/2021 11:55:50 AM ?This report has been signed electronically. ?

## 2021-08-29 NOTE — Progress Notes (Signed)
Pt in recovery with monitors in place, VSS. Report given to receiving RN. Bite guard was placed with pt awake to ensure comfort. No dental or soft tissue damage noted. 

## 2021-09-02 ENCOUNTER — Telehealth: Payer: Self-pay

## 2021-09-02 NOTE — Telephone Encounter (Signed)
?  Follow up Call- ? ? ?  08/29/2021  ? 10:27 AM 12/07/2020  ?  2:04 PM  ?Call back number  ?Post procedure Call Back phone  # [phone]  ?Permission to leave phone message Yes Yes  ?  ? ?Patient questions: ? ?Do you have a fever, pain , or abdominal swelling? No. ?Pain Score  0 * ? ?Have you tolerated food without any problems? Yes.   ? ?Have you been able to return to your normal activities? Yes.   ? ?Do you have any questions about your discharge instructions: ?Diet   No. ?Medications  No. ?Follow up visit  No. ? ?Do you have questions or concerns about your Care? No. ? ?Actions: ?* If pain score is 4 or above: ?No action needed, pain <4. ? ? ?

## 2021-11-01 DIAGNOSIS — R7301 Impaired fasting glucose: Secondary | ICD-10-CM | POA: Diagnosis not present

## 2021-11-01 DIAGNOSIS — L749 Eccrine sweat disorder, unspecified: Secondary | ICD-10-CM | POA: Diagnosis not present

## 2021-11-01 DIAGNOSIS — F329 Major depressive disorder, single episode, unspecified: Secondary | ICD-10-CM | POA: Diagnosis not present

## 2021-11-28 DIAGNOSIS — E559 Vitamin D deficiency, unspecified: Secondary | ICD-10-CM | POA: Diagnosis not present

## 2021-11-28 DIAGNOSIS — M810 Age-related osteoporosis without current pathological fracture: Secondary | ICD-10-CM | POA: Diagnosis not present

## 2021-12-12 ENCOUNTER — Other Ambulatory Visit (HOSPITAL_COMMUNITY): Payer: Self-pay | Admitting: *Deleted

## 2021-12-13 ENCOUNTER — Ambulatory Visit (HOSPITAL_COMMUNITY)
Admission: RE | Admit: 2021-12-13 | Discharge: 2021-12-13 | Disposition: A | Discharge status: Home / Self Care | Payer: PPO | Source: Ambulatory Visit | Attending: Internal Medicine | Admitting: Internal Medicine

## 2021-12-13 DIAGNOSIS — M810 Age-related osteoporosis without current pathological fracture: Secondary | ICD-10-CM | POA: Insufficient documentation

## 2021-12-13 MED ORDER — ZOLEDRONIC ACID 5 MG/100ML IV SOLN
5.0000 mg | Freq: Once | INTRAVENOUS | Status: AC
  Administered 2021-12-13: 5 mg via INTRAVENOUS

## 2021-12-13 MED ORDER — ZOLEDRONIC ACID 5 MG/100ML IV SOLN
INTRAVENOUS | Status: AC
  Filled 2021-12-13: qty 100

## 2021-12-25 DIAGNOSIS — E538 Deficiency of other specified B group vitamins: Secondary | ICD-10-CM | POA: Diagnosis not present

## 2021-12-25 DIAGNOSIS — R7989 Other specified abnormal findings of blood chemistry: Secondary | ICD-10-CM | POA: Diagnosis not present

## 2021-12-25 DIAGNOSIS — R739 Hyperglycemia, unspecified: Secondary | ICD-10-CM | POA: Diagnosis not present

## 2021-12-25 DIAGNOSIS — Z125 Encounter for screening for malignant neoplasm of prostate: Secondary | ICD-10-CM | POA: Diagnosis not present

## 2021-12-25 DIAGNOSIS — E785 Hyperlipidemia, unspecified: Secondary | ICD-10-CM | POA: Diagnosis not present

## 2021-12-25 DIAGNOSIS — I10 Essential (primary) hypertension: Secondary | ICD-10-CM | POA: Diagnosis not present

## 2021-12-25 DIAGNOSIS — D649 Anemia, unspecified: Secondary | ICD-10-CM | POA: Diagnosis not present

## 2021-12-25 DIAGNOSIS — E559 Vitamin D deficiency, unspecified: Secondary | ICD-10-CM | POA: Diagnosis not present

## 2021-12-25 DIAGNOSIS — M109 Gout, unspecified: Secondary | ICD-10-CM | POA: Diagnosis not present

## 2022-01-01 DIAGNOSIS — G379 Demyelinating disease of central nervous system, unspecified: Secondary | ICD-10-CM | POA: Diagnosis not present

## 2022-01-01 DIAGNOSIS — M4840XS Fatigue fracture of vertebra, site unspecified, sequela of fracture: Secondary | ICD-10-CM | POA: Diagnosis not present

## 2022-01-01 DIAGNOSIS — F1099 Alcohol use, unspecified with unspecified alcohol-induced disorder: Secondary | ICD-10-CM | POA: Diagnosis not present

## 2022-01-01 DIAGNOSIS — M797 Fibromyalgia: Secondary | ICD-10-CM | POA: Diagnosis not present

## 2022-01-01 DIAGNOSIS — Z0000 Encounter for general adult medical examination without abnormal findings: Secondary | ICD-10-CM | POA: Diagnosis not present

## 2022-01-01 DIAGNOSIS — N1831 Chronic kidney disease, stage 3a: Secondary | ICD-10-CM | POA: Diagnosis not present

## 2022-01-01 DIAGNOSIS — G720 Drug-induced myopathy: Secondary | ICD-10-CM | POA: Diagnosis not present

## 2022-01-01 DIAGNOSIS — R82998 Other abnormal findings in urine: Secondary | ICD-10-CM | POA: Diagnosis not present

## 2022-01-01 DIAGNOSIS — M810 Age-related osteoporosis without current pathological fracture: Secondary | ICD-10-CM | POA: Diagnosis not present

## 2022-01-01 DIAGNOSIS — I10 Essential (primary) hypertension: Secondary | ICD-10-CM | POA: Diagnosis not present

## 2022-01-01 DIAGNOSIS — D649 Anemia, unspecified: Secondary | ICD-10-CM | POA: Diagnosis not present

## 2022-01-01 DIAGNOSIS — Z23 Encounter for immunization: Secondary | ICD-10-CM | POA: Diagnosis not present

## 2022-01-01 DIAGNOSIS — I2510 Atherosclerotic heart disease of native coronary artery without angina pectoris: Secondary | ICD-10-CM | POA: Diagnosis not present

## 2022-01-01 DIAGNOSIS — M1990 Unspecified osteoarthritis, unspecified site: Secondary | ICD-10-CM | POA: Diagnosis not present

## 2022-01-01 DIAGNOSIS — D509 Iron deficiency anemia, unspecified: Secondary | ICD-10-CM | POA: Diagnosis not present

## 2022-03-28 DIAGNOSIS — H02834 Dermatochalasis of left upper eyelid: Secondary | ICD-10-CM | POA: Diagnosis not present

## 2022-03-28 DIAGNOSIS — Z961 Presence of intraocular lens: Secondary | ICD-10-CM | POA: Diagnosis not present

## 2022-03-28 DIAGNOSIS — H02831 Dermatochalasis of right upper eyelid: Secondary | ICD-10-CM | POA: Diagnosis not present

## 2022-03-28 DIAGNOSIS — H40013 Open angle with borderline findings, low risk, bilateral: Secondary | ICD-10-CM | POA: Diagnosis not present

## 2022-03-28 DIAGNOSIS — H31091 Other chorioretinal scars, right eye: Secondary | ICD-10-CM | POA: Diagnosis not present

## 2022-07-01 DIAGNOSIS — L509 Urticaria, unspecified: Secondary | ICD-10-CM | POA: Diagnosis not present

## 2022-07-01 DIAGNOSIS — I10 Essential (primary) hypertension: Secondary | ICD-10-CM | POA: Diagnosis not present

## 2022-07-03 DIAGNOSIS — N401 Enlarged prostate with lower urinary tract symptoms: Secondary | ICD-10-CM | POA: Diagnosis not present

## 2022-07-03 DIAGNOSIS — N529 Male erectile dysfunction, unspecified: Secondary | ICD-10-CM | POA: Diagnosis not present

## 2022-07-03 DIAGNOSIS — N138 Other obstructive and reflux uropathy: Secondary | ICD-10-CM | POA: Diagnosis not present

## 2022-07-24 DIAGNOSIS — M797 Fibromyalgia: Secondary | ICD-10-CM | POA: Diagnosis not present

## 2022-07-24 DIAGNOSIS — L509 Urticaria, unspecified: Secondary | ICD-10-CM | POA: Diagnosis not present

## 2022-07-24 DIAGNOSIS — I129 Hypertensive chronic kidney disease with stage 1 through stage 4 chronic kidney disease, or unspecified chronic kidney disease: Secondary | ICD-10-CM | POA: Diagnosis not present

## 2022-07-24 DIAGNOSIS — I7090 Unspecified atherosclerosis: Secondary | ICD-10-CM | POA: Diagnosis not present

## 2022-07-24 DIAGNOSIS — M810 Age-related osteoporosis without current pathological fracture: Secondary | ICD-10-CM | POA: Diagnosis not present

## 2022-07-24 DIAGNOSIS — N1831 Chronic kidney disease, stage 3a: Secondary | ICD-10-CM | POA: Diagnosis not present

## 2022-07-24 DIAGNOSIS — M1990 Unspecified osteoarthritis, unspecified site: Secondary | ICD-10-CM | POA: Diagnosis not present

## 2022-07-24 DIAGNOSIS — G720 Drug-induced myopathy: Secondary | ICD-10-CM | POA: Diagnosis not present

## 2022-07-24 DIAGNOSIS — E785 Hyperlipidemia, unspecified: Secondary | ICD-10-CM | POA: Diagnosis not present

## 2022-07-24 DIAGNOSIS — F1099 Alcohol use, unspecified with unspecified alcohol-induced disorder: Secondary | ICD-10-CM | POA: Diagnosis not present

## 2022-07-24 DIAGNOSIS — I6389 Other cerebral infarction: Secondary | ICD-10-CM | POA: Diagnosis not present

## 2022-07-24 DIAGNOSIS — M4840XS Fatigue fracture of vertebra, site unspecified, sequela of fracture: Secondary | ICD-10-CM | POA: Diagnosis not present

## 2022-07-24 DIAGNOSIS — R7301 Impaired fasting glucose: Secondary | ICD-10-CM | POA: Diagnosis not present

## 2022-07-30 DIAGNOSIS — H61001 Unspecified perichondritis of right external ear: Secondary | ICD-10-CM | POA: Diagnosis not present

## 2022-07-30 DIAGNOSIS — L309 Dermatitis, unspecified: Secondary | ICD-10-CM | POA: Diagnosis not present

## 2022-07-30 DIAGNOSIS — L821 Other seborrheic keratosis: Secondary | ICD-10-CM | POA: Diagnosis not present

## 2022-07-30 DIAGNOSIS — D2272 Melanocytic nevi of left lower limb, including hip: Secondary | ICD-10-CM | POA: Diagnosis not present

## 2022-07-30 DIAGNOSIS — Z85828 Personal history of other malignant neoplasm of skin: Secondary | ICD-10-CM | POA: Diagnosis not present

## 2022-07-30 DIAGNOSIS — D2271 Melanocytic nevi of right lower limb, including hip: Secondary | ICD-10-CM | POA: Diagnosis not present

## 2022-07-30 DIAGNOSIS — D225 Melanocytic nevi of trunk: Secondary | ICD-10-CM | POA: Diagnosis not present

## 2022-12-02 DIAGNOSIS — N1831 Chronic kidney disease, stage 3a: Secondary | ICD-10-CM | POA: Diagnosis not present

## 2022-12-02 DIAGNOSIS — E559 Vitamin D deficiency, unspecified: Secondary | ICD-10-CM | POA: Diagnosis not present

## 2022-12-02 DIAGNOSIS — M810 Age-related osteoporosis without current pathological fracture: Secondary | ICD-10-CM | POA: Diagnosis not present

## 2022-12-04 ENCOUNTER — Other Ambulatory Visit (HOSPITAL_COMMUNITY): Payer: Self-pay | Admitting: *Deleted

## 2022-12-08 ENCOUNTER — Ambulatory Visit (HOSPITAL_COMMUNITY)
Admission: RE | Admit: 2022-12-08 | Discharge: 2022-12-08 | Disposition: A | Discharge status: Home / Self Care | Payer: PPO | Source: Ambulatory Visit | Attending: Internal Medicine | Admitting: Internal Medicine

## 2022-12-08 DIAGNOSIS — M810 Age-related osteoporosis without current pathological fracture: Secondary | ICD-10-CM | POA: Diagnosis not present

## 2022-12-08 MED ORDER — ZOLEDRONIC ACID 5 MG/100ML IV SOLN
INTRAVENOUS | Status: AC
  Filled 2022-12-08: qty 100

## 2022-12-08 MED ORDER — ZOLEDRONIC ACID 5 MG/100ML IV SOLN
5.0000 mg | Freq: Once | INTRAVENOUS | Status: AC
  Administered 2022-12-08: 5 mg via INTRAVENOUS

## 2022-12-17 DIAGNOSIS — L720 Epidermal cyst: Secondary | ICD-10-CM | POA: Diagnosis not present

## 2022-12-17 DIAGNOSIS — L821 Other seborrheic keratosis: Secondary | ICD-10-CM | POA: Diagnosis not present

## 2022-12-17 DIAGNOSIS — Z85828 Personal history of other malignant neoplasm of skin: Secondary | ICD-10-CM | POA: Diagnosis not present

## 2023-02-13 DIAGNOSIS — D699 Hemorrhagic condition, unspecified: Secondary | ICD-10-CM | POA: Diagnosis not present

## 2023-02-13 DIAGNOSIS — Z1212 Encounter for screening for malignant neoplasm of rectum: Secondary | ICD-10-CM | POA: Diagnosis not present

## 2023-02-13 DIAGNOSIS — I10 Essential (primary) hypertension: Secondary | ICD-10-CM | POA: Diagnosis not present

## 2023-02-13 DIAGNOSIS — E538 Deficiency of other specified B group vitamins: Secondary | ICD-10-CM | POA: Diagnosis not present

## 2023-02-13 DIAGNOSIS — E785 Hyperlipidemia, unspecified: Secondary | ICD-10-CM | POA: Diagnosis not present

## 2023-02-13 DIAGNOSIS — M109 Gout, unspecified: Secondary | ICD-10-CM | POA: Diagnosis not present

## 2023-02-13 DIAGNOSIS — R7301 Impaired fasting glucose: Secondary | ICD-10-CM | POA: Diagnosis not present

## 2023-02-25 DIAGNOSIS — N1831 Chronic kidney disease, stage 3a: Secondary | ICD-10-CM | POA: Diagnosis not present

## 2023-02-25 DIAGNOSIS — E785 Hyperlipidemia, unspecified: Secondary | ICD-10-CM | POA: Diagnosis not present

## 2023-02-25 DIAGNOSIS — I7090 Unspecified atherosclerosis: Secondary | ICD-10-CM | POA: Diagnosis not present

## 2023-02-25 DIAGNOSIS — I129 Hypertensive chronic kidney disease with stage 1 through stage 4 chronic kidney disease, or unspecified chronic kidney disease: Secondary | ICD-10-CM | POA: Diagnosis not present

## 2023-02-25 DIAGNOSIS — Z0000 Encounter for general adult medical examination without abnormal findings: Secondary | ICD-10-CM | POA: Diagnosis not present

## 2023-02-25 DIAGNOSIS — I10 Essential (primary) hypertension: Secondary | ICD-10-CM | POA: Diagnosis not present

## 2023-02-25 DIAGNOSIS — R82998 Other abnormal findings in urine: Secondary | ICD-10-CM | POA: Diagnosis not present

## 2023-02-25 DIAGNOSIS — G379 Demyelinating disease of central nervous system, unspecified: Secondary | ICD-10-CM | POA: Diagnosis not present

## 2023-02-25 DIAGNOSIS — G720 Drug-induced myopathy: Secondary | ICD-10-CM | POA: Diagnosis not present

## 2023-02-25 DIAGNOSIS — K5090 Crohn's disease, unspecified, without complications: Secondary | ICD-10-CM | POA: Diagnosis not present

## 2023-02-25 DIAGNOSIS — Z23 Encounter for immunization: Secondary | ICD-10-CM | POA: Diagnosis not present

## 2023-02-25 DIAGNOSIS — I2510 Atherosclerotic heart disease of native coronary artery without angina pectoris: Secondary | ICD-10-CM | POA: Diagnosis not present

## 2023-02-25 DIAGNOSIS — E669 Obesity, unspecified: Secondary | ICD-10-CM | POA: Diagnosis not present

## 2023-03-02 ENCOUNTER — Ambulatory Visit: Payer: PPO | Admitting: Nurse Practitioner

## 2023-04-06 DIAGNOSIS — H40013 Open angle with borderline findings, low risk, bilateral: Secondary | ICD-10-CM | POA: Diagnosis not present

## 2023-04-06 DIAGNOSIS — Z961 Presence of intraocular lens: Secondary | ICD-10-CM | POA: Diagnosis not present

## 2023-04-06 DIAGNOSIS — H02831 Dermatochalasis of right upper eyelid: Secondary | ICD-10-CM | POA: Diagnosis not present

## 2023-04-06 DIAGNOSIS — H02834 Dermatochalasis of left upper eyelid: Secondary | ICD-10-CM | POA: Diagnosis not present

## 2023-04-06 DIAGNOSIS — H31091 Other chorioretinal scars, right eye: Secondary | ICD-10-CM | POA: Diagnosis not present

## 2023-04-16 DIAGNOSIS — L720 Epidermal cyst: Secondary | ICD-10-CM | POA: Diagnosis not present

## 2023-04-16 DIAGNOSIS — Z85828 Personal history of other malignant neoplasm of skin: Secondary | ICD-10-CM | POA: Diagnosis not present

## 2023-05-28 ENCOUNTER — Ambulatory Visit: Payer: PPO | Admitting: Physician Assistant

## 2023-06-15 ENCOUNTER — Emergency Department (HOSPITAL_BASED_OUTPATIENT_CLINIC_OR_DEPARTMENT_OTHER)
Admission: EM | Admit: 2023-06-15 | Discharge: 2023-06-15 | Disposition: A | Discharge status: Home / Self Care | Payer: PPO | Attending: Emergency Medicine | Admitting: Emergency Medicine

## 2023-06-15 ENCOUNTER — Other Ambulatory Visit: Payer: Self-pay

## 2023-06-15 ENCOUNTER — Encounter (HOSPITAL_BASED_OUTPATIENT_CLINIC_OR_DEPARTMENT_OTHER): Payer: Self-pay | Admitting: Emergency Medicine

## 2023-06-15 DIAGNOSIS — Z79899 Other long term (current) drug therapy: Secondary | ICD-10-CM | POA: Diagnosis not present

## 2023-06-15 DIAGNOSIS — R202 Paresthesia of skin: Secondary | ICD-10-CM | POA: Diagnosis not present

## 2023-06-15 DIAGNOSIS — M542 Cervicalgia: Secondary | ICD-10-CM | POA: Insufficient documentation

## 2023-06-15 DIAGNOSIS — R200 Anesthesia of skin: Secondary | ICD-10-CM | POA: Diagnosis not present

## 2023-06-15 LAB — URINALYSIS, ROUTINE W REFLEX MICROSCOPIC
Bilirubin Urine: NEGATIVE
Glucose, UA: NEGATIVE mg/dL
Hgb urine dipstick: NEGATIVE
Ketones, ur: NEGATIVE mg/dL
Leukocytes,Ua: NEGATIVE
Nitrite: NEGATIVE
Protein, ur: NEGATIVE mg/dL
Specific Gravity, Urine: 1.008 (ref 1.005–1.030)
pH: 6.5 (ref 5.0–8.0)

## 2023-06-15 LAB — BASIC METABOLIC PANEL
Anion gap: 9 (ref 5–15)
BUN: 11 mg/dL (ref 8–23)
CO2: 27 mmol/L (ref 22–32)
Calcium: 9.4 mg/dL (ref 8.9–10.3)
Chloride: 100 mmol/L (ref 98–111)
Creatinine, Ser: 1.16 mg/dL (ref 0.61–1.24)
GFR, Estimated: >60 mL/min (ref >60)
Glucose, Bld: 103 mg/dL — ABNORMAL HIGH (ref 70–99)
Potassium: 4.4 mmol/L (ref 3.5–5.1)
Sodium: 136 mmol/L (ref 135–145)

## 2023-06-15 LAB — CBC
HCT: 43.2 % (ref 39.0–52.0)
Hemoglobin: 14.4 g/dL (ref 13.0–17.0)
MCH: 30.3 pg (ref 26.0–34.0)
MCHC: 33.3 g/dL (ref 30.0–36.0)
MCV: 90.8 fL (ref 80.0–100.0)
Platelets: 207 10*3/uL (ref 150–400)
RBC: 4.76 MIL/uL (ref 4.22–5.81)
RDW: 13.8 % (ref 11.5–15.5)
WBC: 6.6 10*3/uL (ref 4.0–10.5)
nRBC: 0 % (ref 0.0–0.2)

## 2023-06-15 NOTE — ED Provider Triage Note (Signed)
Emergency Medicine Provider Triage Evaluation Note  Alexander Lambert , a 78 y.o. male  was evaluated in triage.  Pt complains of intermittent arm numbness and "pins and needle" feeling for four days. No confusion, falls, slurred speech  Called PCP and they recommended evaluation  Wife at bedside denies ams  Review of Systems  Positive: *** Negative: ***  Physical Exam  BP 112/78 (BP Location: Right Arm)   Pulse 81   Temp 97.9 F (36.6 C)   Resp 16   Wt 96.6 kg   SpO2 100%   BMI 31.45 kg/m  Gen:   Awake, no distress . A&Ox3   Resp:  Normal effort  MSK:   Moves extremities without difficulty  Other:    Medical Decision Making  Medically screening exam initiated at 6:15 PM.  Appropriate orders placed.  Alexander Lambert was informed that the remainder of the evaluation will be completed by another provider, this initial triage assessment does not replace that evaluation, and the importance of remaining in the ED until their evaluation is complete.  Labwork unremarkable

## 2023-06-15 NOTE — Discharge Instructions (Addendum)
Thank you for letting us evaluate you today. Please follow up with PCP regarding further management.  Return to ED if you experience weakness numbness of an entire one side of your body, slurred speech, altered mentation, worsening of symptoms.   Please  make sure to follow up with PCP

## 2023-06-15 NOTE — ED Provider Notes (Signed)
[place] EMERGENCY DEPARTMENT AT [facility] Provider Note   CSN: [id number] Arrival date & time: 06/15/23  1350     History {Add pertinent medical, surgical, social history, OB history to HPI:1} Chief Complaint  Patient presents with   Arm Numbness    Alexander Lambert is a 78 y.o. male.  HPI     Home Medications Prior to Admission medications   Medication Sig Start Date End Date Taking? Authorizing Provider  ALPRAZolam Prudy Feeler) 0.5 MG tablet Take 1.5 mg by mouth at bedtime as needed. 11/08/20   [provider]  amLODipine (NORVASC) 5 MG tablet Take 5 mg by mouth daily. 10/26/20   [provider]  buPROPion (WELLBUTRIN XL) 300 MG 24 hr tablet Take 300 mg by mouth daily. 10/15/20   [provider]  Co-Enzyme Q10 200 MG CAPS Take 200 mg by mouth daily.    [provider]  ergocalciferol (VITAMIN D2) 50000 UNITS capsule Take 50,000 Units by mouth 2 (two) times a week.     [provider]  escitalopram (LEXAPRO) 20 MG tablet Take 30 mg by mouth daily. 11/09/20   [provider]  ferrous sulfate 325 (65 FE) MG tablet TAKE ONE TABLET TWICE DAILY 10/17/15   Hilarie Fredrickson, MD  REPATHA SURECLICK 140 MG/ML SOAJ SMARTSIG:1 Injection SUB-Q Twice Monthly 10/13/20   [provider]  valsartan (DIOVAN) 160 MG tablet SMARTSIG:1 Tablet(s) By Mouth Every Evening 10/12/20   [provider]  vitamin B-12 (CYANOCOBALAMIN) 1000 MCG tablet Take 1,000 mcg by mouth daily.      [provider]  Zoledronic Acid (RECLAST IV) Inject into the vein. yearly    [provider]      Allergies    Phenergan [promethazine hcl]    Review of Systems   Review of Systems  Physical Exam Updated Vital Signs BP 112/78 (BP Location: Right Arm)   Pulse 81   Temp 97.9 F (36.6 C)   Resp 16   Wt 96.6 kg   SpO2 100%   BMI 31.45 kg/m  Physical Exam  ED Results / Procedures / Treatments   Labs (all labs ordered are listed, but  only abnormal results are displayed) Labs Reviewed  BASIC METABOLIC PANEL - Abnormal; Notable for the following components:      Result Value   Glucose, Bld 103 (*)    All other components within normal limits  CBC  URINALYSIS, ROUTINE W REFLEX MICROSCOPIC    EKG EKG Interpretation Date/Time:  Monday June 15 2023 14:00:10 EST Ventricular Rate:  75 PR Interval:  160 QRS Duration:  102 QT Interval:  398 QTC Calculation: 444 R Axis:   250  Text Interpretation: *** Suspect arm lead reversal, interpretation assumes no reversal Sinus rhythm with Premature supraventricular complexes Right superior axis deviation Pulmonary disease pattern Incomplete right bundle branch block Right ventricular hypertrophy Possible Inferior infarct , age undetermined Abnormal ECG new rbbb type pattern since 2018 Otherwise no significant change Confirmed by Melene Plan [phone]) on 06/15/2023 3:44:59 PM  Radiology No results found.  Procedures Procedures  {Document cardiac monitor, telemetry assessment procedure when appropriate:1}  Medications Ordered in ED Medications - No data to display  ED Course/ Medical Decision Making/ A&P   {   Click here for ABCD2, HEART and other calculatorsREFRESH Note before signing :1}  Medical Decision Making Amount and/or Complexity of Data Reviewed Labs: ordered.   ***  {Document critical care time when appropriate:1} {Document review of labs and clinical decision tools ie heart score, Chads2Vasc2 etc:1}  {Document your independent review of radiology images, and any outside records:1} {Document your discussion with family members, caretakers, and with consultants:1} {Document social determinants of health affecting pt's care:1} {Document your decision making why or why not admission, treatments were needed:1} Final Clinical Impression(s) / ED Diagnoses Final diagnoses:  Paresthesia of arm    Rx / DC Orders ED Discharge Orders      None

## 2023-06-15 NOTE — ED Triage Notes (Signed)
Pt c/o LT arm numbness x 4 days. Denies injury, denies CP, or HA, denies weakness. Pt bil equal, speech clear.  Pt reports neck pain intermittently

## 2023-06-22 ENCOUNTER — Other Ambulatory Visit (HOSPITAL_BASED_OUTPATIENT_CLINIC_OR_DEPARTMENT_OTHER): Payer: Self-pay | Admitting: Family Medicine

## 2023-06-22 DIAGNOSIS — I6389 Other cerebral infarction: Secondary | ICD-10-CM | POA: Diagnosis not present

## 2023-06-22 DIAGNOSIS — R202 Paresthesia of skin: Secondary | ICD-10-CM

## 2023-06-22 DIAGNOSIS — G250 Essential tremor: Secondary | ICD-10-CM | POA: Diagnosis not present

## 2023-06-22 DIAGNOSIS — M542 Cervicalgia: Secondary | ICD-10-CM | POA: Diagnosis not present

## 2023-06-24 ENCOUNTER — Other Ambulatory Visit (HOSPITAL_BASED_OUTPATIENT_CLINIC_OR_DEPARTMENT_OTHER): Payer: Self-pay | Admitting: Family Medicine

## 2023-06-24 DIAGNOSIS — M542 Cervicalgia: Secondary | ICD-10-CM

## 2023-07-02 ENCOUNTER — Ambulatory Visit (HOSPITAL_BASED_OUTPATIENT_CLINIC_OR_DEPARTMENT_OTHER)
Admission: RE | Admit: 2023-07-02 | Discharge: 2023-07-02 | Disposition: A | Discharge status: Home / Self Care | Payer: PPO | Source: Ambulatory Visit | Attending: Family Medicine | Admitting: Family Medicine

## 2023-07-02 DIAGNOSIS — G319 Degenerative disease of nervous system, unspecified: Secondary | ICD-10-CM | POA: Diagnosis not present

## 2023-07-02 DIAGNOSIS — R9082 White matter disease, unspecified: Secondary | ICD-10-CM | POA: Insufficient documentation

## 2023-07-02 DIAGNOSIS — M50021 Cervical disc disorder at C4-C5 level with myelopathy: Secondary | ICD-10-CM | POA: Insufficient documentation

## 2023-07-02 DIAGNOSIS — M542 Cervicalgia: Secondary | ICD-10-CM | POA: Insufficient documentation

## 2023-07-02 DIAGNOSIS — M4802 Spinal stenosis, cervical region: Secondary | ICD-10-CM | POA: Insufficient documentation

## 2023-07-02 DIAGNOSIS — R200 Anesthesia of skin: Secondary | ICD-10-CM | POA: Diagnosis not present

## 2023-07-02 DIAGNOSIS — Z8673 Personal history of transient ischemic attack (TIA), and cerebral infarction without residual deficits: Secondary | ICD-10-CM | POA: Insufficient documentation

## 2023-07-02 DIAGNOSIS — R202 Paresthesia of skin: Secondary | ICD-10-CM | POA: Diagnosis not present

## 2023-07-02 DIAGNOSIS — M50221 Other cervical disc displacement at C4-C5 level: Secondary | ICD-10-CM | POA: Diagnosis not present

## 2023-07-02 MED ORDER — GADOBUTROL 1 MMOL/ML IV SOLN
10.0000 mL | Freq: Once | INTRAVENOUS | Status: AC | PRN
  Administered 2023-07-02: 10 mL via INTRAVENOUS
  Filled 2023-07-02: qty 10

## 2023-07-28 DIAGNOSIS — M4322 Fusion of spine, cervical region: Secondary | ICD-10-CM | POA: Diagnosis not present

## 2023-07-28 DIAGNOSIS — I6389 Other cerebral infarction: Secondary | ICD-10-CM | POA: Diagnosis not present

## 2023-07-28 DIAGNOSIS — M542 Cervicalgia: Secondary | ICD-10-CM | POA: Diagnosis not present

## 2023-07-28 DIAGNOSIS — I6381 Other cerebral infarction due to occlusion or stenosis of small artery: Secondary | ICD-10-CM | POA: Diagnosis not present

## 2023-07-28 DIAGNOSIS — G250 Essential tremor: Secondary | ICD-10-CM | POA: Diagnosis not present

## 2023-07-28 DIAGNOSIS — R202 Paresthesia of skin: Secondary | ICD-10-CM | POA: Diagnosis not present

## 2023-09-01 DIAGNOSIS — L817 Pigmented purpuric dermatosis: Secondary | ICD-10-CM | POA: Diagnosis not present

## 2023-09-01 DIAGNOSIS — Z85828 Personal history of other malignant neoplasm of skin: Secondary | ICD-10-CM | POA: Diagnosis not present

## 2023-09-01 DIAGNOSIS — L812 Freckles: Secondary | ICD-10-CM | POA: Diagnosis not present

## 2023-09-01 DIAGNOSIS — L821 Other seborrheic keratosis: Secondary | ICD-10-CM | POA: Diagnosis not present

## 2023-09-01 DIAGNOSIS — L603 Nail dystrophy: Secondary | ICD-10-CM | POA: Diagnosis not present

## 2023-09-03 DIAGNOSIS — N529 Male erectile dysfunction, unspecified: Secondary | ICD-10-CM | POA: Diagnosis not present

## 2023-09-03 DIAGNOSIS — N401 Enlarged prostate with lower urinary tract symptoms: Secondary | ICD-10-CM | POA: Diagnosis not present

## 2023-09-03 DIAGNOSIS — N138 Other obstructive and reflux uropathy: Secondary | ICD-10-CM | POA: Diagnosis not present

## 2023-09-03 DIAGNOSIS — R3912 Poor urinary stream: Secondary | ICD-10-CM | POA: Diagnosis not present

## 2023-09-07 ENCOUNTER — Encounter (HOSPITAL_BASED_OUTPATIENT_CLINIC_OR_DEPARTMENT_OTHER): Payer: Self-pay | Admitting: Internal Medicine

## 2023-09-07 ENCOUNTER — Ambulatory Visit (HOSPITAL_BASED_OUTPATIENT_CLINIC_OR_DEPARTMENT_OTHER): Admitting: Internal Medicine

## 2023-09-07 VITALS — BP 94/60 | HR 86 | Ht 71.0 in | Wt 209.0 lb

## 2023-09-07 DIAGNOSIS — T466X5D Adverse effect of antihyperlipidemic and antiarteriosclerotic drugs, subsequent encounter: Secondary | ICD-10-CM | POA: Diagnosis not present

## 2023-09-07 DIAGNOSIS — M791 Myalgia, unspecified site: Secondary | ICD-10-CM

## 2023-09-07 DIAGNOSIS — E785 Hyperlipidemia, unspecified: Secondary | ICD-10-CM | POA: Diagnosis not present

## 2023-09-07 DIAGNOSIS — Z8673 Personal history of transient ischemic attack (TIA), and cerebral infarction without residual deficits: Secondary | ICD-10-CM | POA: Diagnosis not present

## 2023-09-07 NOTE — Patient Instructions (Signed)
 Medication Instructions:  NO CHANGES  *If you need a refill on your cardiac medications before your next appointment, please call your pharmacy*  Lab Work: FASTING lab work in 6 months -- NMR lipoprofile and LPa Complete at [facility] (3rd floor), [facility] (1st floor) OR any [facility]  If you have labs (blood work) drawn today and your tests are completely normal, you will receive your results only by: MyChart Message (if you have MyChart) OR A paper copy in the mail If you have any lab test that is abnormal or we need to change your treatment, we will call you to review the results.   Follow-Up: At [facility], you and your health needs are our priority.  As part of our continuing mission to provide you with exceptional heart care, our providers are all part of one team.  This team includes your primary Cardiologist (physician) and Advanced Practice Providers or APPs (Physician Assistants and Nurse Practitioners) who all work together to provide you with the care you need, when you need it.  Your next appointment:    6 months with Dr. Maximo Spar   [facility] -- [address] [place] -- 5th Floor  We recommend signing up for the patient portal called "MyChart".  Sign up information is provided on this After Visit Summary.  MyChart is used to connect with patients for Virtual Visits (Telemedicine).  Patients are able to view lab/test results, encounter notes, upcoming appointments, etc.  Non-urgent messages can be sent to your provider as well.   To learn more about what you can do with MyChart, go to [email or web address].
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
## 2023-09-11 DIAGNOSIS — F1099 Alcohol use, unspecified with unspecified alcohol-induced disorder: Secondary | ICD-10-CM | POA: Diagnosis not present

## 2023-09-11 DIAGNOSIS — E785 Hyperlipidemia, unspecified: Secondary | ICD-10-CM | POA: Diagnosis not present

## 2023-09-11 DIAGNOSIS — I2510 Atherosclerotic heart disease of native coronary artery without angina pectoris: Secondary | ICD-10-CM | POA: Diagnosis not present

## 2023-09-11 DIAGNOSIS — M4840XS Fatigue fracture of vertebra, site unspecified, sequela of fracture: Secondary | ICD-10-CM | POA: Diagnosis not present

## 2023-09-11 DIAGNOSIS — N1831 Chronic kidney disease, stage 3a: Secondary | ICD-10-CM | POA: Diagnosis not present

## 2023-09-11 DIAGNOSIS — G379 Demyelinating disease of central nervous system, unspecified: Secondary | ICD-10-CM | POA: Diagnosis not present

## 2023-09-11 DIAGNOSIS — D649 Anemia, unspecified: Secondary | ICD-10-CM | POA: Diagnosis not present

## 2023-09-11 DIAGNOSIS — R7301 Impaired fasting glucose: Secondary | ICD-10-CM | POA: Diagnosis not present

## 2023-09-11 DIAGNOSIS — M810 Age-related osteoporosis without current pathological fracture: Secondary | ICD-10-CM | POA: Diagnosis not present

## 2023-09-11 DIAGNOSIS — G720 Drug-induced myopathy: Secondary | ICD-10-CM | POA: Diagnosis not present

## 2023-09-11 DIAGNOSIS — I129 Hypertensive chronic kidney disease with stage 1 through stage 4 chronic kidney disease, or unspecified chronic kidney disease: Secondary | ICD-10-CM | POA: Diagnosis not present

## 2023-09-11 DIAGNOSIS — F329 Major depressive disorder, single episode, unspecified: Secondary | ICD-10-CM | POA: Diagnosis not present

## 2023-09-11 DIAGNOSIS — K5090 Crohn's disease, unspecified, without complications: Secondary | ICD-10-CM | POA: Diagnosis not present

## 2023-10-02 DIAGNOSIS — N401 Enlarged prostate with lower urinary tract symptoms: Secondary | ICD-10-CM | POA: Diagnosis not present

## 2023-10-02 DIAGNOSIS — N138 Other obstructive and reflux uropathy: Secondary | ICD-10-CM | POA: Diagnosis not present

## 2023-10-07 ENCOUNTER — Institutional Professional Consult (permissible substitution) (HOSPITAL_BASED_OUTPATIENT_CLINIC_OR_DEPARTMENT_OTHER): Payer: PPO | Admitting: Internal Medicine

## 2023-10-08 DIAGNOSIS — I10 Essential (primary) hypertension: Secondary | ICD-10-CM | POA: Diagnosis not present

## 2023-10-08 DIAGNOSIS — S60512A Abrasion of left hand, initial encounter: Secondary | ICD-10-CM | POA: Diagnosis not present

## 2023-10-13 DIAGNOSIS — R339 Retention of urine, unspecified: Secondary | ICD-10-CM | POA: Diagnosis not present

## 2023-10-15 DIAGNOSIS — R339 Retention of urine, unspecified: Secondary | ICD-10-CM | POA: Diagnosis not present

## 2023-10-29 DIAGNOSIS — N401 Enlarged prostate with lower urinary tract symptoms: Secondary | ICD-10-CM | POA: Diagnosis not present

## 2023-10-29 DIAGNOSIS — R338 Other retention of urine: Secondary | ICD-10-CM | POA: Diagnosis not present

## 2023-11-12 DIAGNOSIS — R339 Retention of urine, unspecified: Secondary | ICD-10-CM | POA: Diagnosis not present

## 2023-12-08 DIAGNOSIS — M810 Age-related osteoporosis without current pathological fracture: Secondary | ICD-10-CM | POA: Diagnosis not present

## 2023-12-08 DIAGNOSIS — E559 Vitamin D deficiency, unspecified: Secondary | ICD-10-CM | POA: Diagnosis not present

## 2023-12-08 DIAGNOSIS — I129 Hypertensive chronic kidney disease with stage 1 through stage 4 chronic kidney disease, or unspecified chronic kidney disease: Secondary | ICD-10-CM | POA: Diagnosis not present

## 2023-12-08 DIAGNOSIS — N1831 Chronic kidney disease, stage 3a: Secondary | ICD-10-CM | POA: Diagnosis not present

## 2023-12-09 ENCOUNTER — Telehealth (HOSPITAL_COMMUNITY): Payer: Self-pay

## 2023-12-09 NOTE — Telephone Encounter (Signed)
 Auth Submission: NO AUTH NEEDED Site of care: [facility] Payer: [employer] Medication & CPT/J Code(s) submitted: Reclast  (Zolendronic acid) J3489 Diagnosis Code: M81.0 Route of submission (phone, fax, portal):  Phone # Fax # Auth type: Buy/Bill HB Units/visits requested: 5mg  x 1 dose Reference number:  Approval from: 12/09/23 to 05/11/24

## 2023-12-10 DIAGNOSIS — R351 Nocturia: Secondary | ICD-10-CM | POA: Diagnosis not present

## 2023-12-10 DIAGNOSIS — N401 Enlarged prostate with lower urinary tract symptoms: Secondary | ICD-10-CM | POA: Diagnosis not present

## 2023-12-10 DIAGNOSIS — N529 Male erectile dysfunction, unspecified: Secondary | ICD-10-CM | POA: Diagnosis not present

## 2023-12-10 DIAGNOSIS — N312 Flaccid neuropathic bladder, not elsewhere classified: Secondary | ICD-10-CM | POA: Diagnosis not present

## 2023-12-10 DIAGNOSIS — R339 Retention of urine, unspecified: Secondary | ICD-10-CM | POA: Diagnosis not present

## 2023-12-10 DIAGNOSIS — N138 Other obstructive and reflux uropathy: Secondary | ICD-10-CM | POA: Diagnosis not present

## 2023-12-21 ENCOUNTER — Other Ambulatory Visit (HOSPITAL_COMMUNITY): Payer: Self-pay

## 2023-12-22 ENCOUNTER — Inpatient Hospital Stay (HOSPITAL_COMMUNITY): Admission: RE | Admit: 2023-12-22 | Source: Ambulatory Visit

## 2023-12-22 DIAGNOSIS — R339 Retention of urine, unspecified: Secondary | ICD-10-CM | POA: Diagnosis not present

## 2023-12-31 ENCOUNTER — Ambulatory Visit (HOSPITAL_COMMUNITY)
Admission: RE | Admit: 2023-12-31 | Discharge: 2023-12-31 | Disposition: A | Discharge status: Home / Self Care | Source: Ambulatory Visit | Attending: Internal Medicine | Admitting: Internal Medicine

## 2023-12-31 DIAGNOSIS — M810 Age-related osteoporosis without current pathological fracture: Secondary | ICD-10-CM | POA: Insufficient documentation

## 2023-12-31 MED ORDER — ZOLEDRONIC ACID 5 MG/100ML IV SOLN
5.0000 mg | Freq: Once | INTRAVENOUS | Status: AC
  Administered 2023-12-31: 5 mg via INTRAVENOUS

## 2023-12-31 MED ORDER — ZOLEDRONIC ACID 5 MG/100ML IV SOLN
INTRAVENOUS | Status: AC
  Filled 2023-12-31: qty 100

## 2024-01-18 DIAGNOSIS — R509 Fever, unspecified: Secondary | ICD-10-CM | POA: Diagnosis not present

## 2024-01-18 DIAGNOSIS — B349 Viral infection, unspecified: Secondary | ICD-10-CM | POA: Diagnosis not present

## 2024-01-18 DIAGNOSIS — R5383 Other fatigue: Secondary | ICD-10-CM | POA: Diagnosis not present

## 2024-01-18 DIAGNOSIS — Z20822 Contact with and (suspected) exposure to covid-19: Secondary | ICD-10-CM | POA: Diagnosis not present

## 2024-02-04 DIAGNOSIS — R339 Retention of urine, unspecified: Secondary | ICD-10-CM | POA: Diagnosis not present

## 2024-02-19 DIAGNOSIS — N401 Enlarged prostate with lower urinary tract symptoms: Secondary | ICD-10-CM | POA: Diagnosis not present

## 2024-02-19 DIAGNOSIS — R399 Unspecified symptoms and signs involving the genitourinary system: Secondary | ICD-10-CM | POA: Diagnosis not present

## 2024-02-19 DIAGNOSIS — R339 Retention of urine, unspecified: Secondary | ICD-10-CM | POA: Diagnosis not present

## 2024-04-12 DIAGNOSIS — N401 Enlarged prostate with lower urinary tract symptoms: Secondary | ICD-10-CM | POA: Diagnosis not present

## 2024-04-12 DIAGNOSIS — R339 Retention of urine, unspecified: Secondary | ICD-10-CM | POA: Diagnosis not present

## 2024-04-18 DIAGNOSIS — Z961 Presence of intraocular lens: Secondary | ICD-10-CM | POA: Diagnosis not present

## 2024-04-18 DIAGNOSIS — H40013 Open angle with borderline findings, low risk, bilateral: Secondary | ICD-10-CM | POA: Diagnosis not present

## 2024-04-18 DIAGNOSIS — H02831 Dermatochalasis of right upper eyelid: Secondary | ICD-10-CM | POA: Diagnosis not present

## 2024-04-18 DIAGNOSIS — H02834 Dermatochalasis of left upper eyelid: Secondary | ICD-10-CM | POA: Diagnosis not present

## 2024-04-18 DIAGNOSIS — H31091 Other chorioretinal scars, right eye: Secondary | ICD-10-CM | POA: Diagnosis not present

## 2024-04-27 ENCOUNTER — Other Ambulatory Visit: Payer: Self-pay | Admitting: Ophthalmology

## 2024-04-28 LAB — SURGICAL PATHOLOGY

## 2024-04-30 ENCOUNTER — Telehealth: Payer: Self-pay

## 2024-04-30 ENCOUNTER — Observation Stay (HOSPITAL_COMMUNITY)
Admission: EM | Admit: 2024-04-30 | Discharge: 2024-05-01 | Disposition: A | Source: Home / Self Care | Attending: Emergency Medicine | Admitting: Emergency Medicine

## 2024-04-30 ENCOUNTER — Encounter (HOSPITAL_COMMUNITY): Payer: Self-pay | Admitting: *Deleted

## 2024-04-30 ENCOUNTER — Other Ambulatory Visit: Payer: Self-pay

## 2024-04-30 DIAGNOSIS — F419 Anxiety disorder, unspecified: Secondary | ICD-10-CM | POA: Diagnosis present

## 2024-04-30 DIAGNOSIS — Z79899 Other long term (current) drug therapy: Secondary | ICD-10-CM | POA: Insufficient documentation

## 2024-04-30 DIAGNOSIS — Z87891 Personal history of nicotine dependence: Secondary | ICD-10-CM | POA: Diagnosis not present

## 2024-04-30 DIAGNOSIS — I10 Essential (primary) hypertension: Secondary | ICD-10-CM | POA: Diagnosis present

## 2024-04-30 DIAGNOSIS — R319 Hematuria, unspecified: Principal | ICD-10-CM | POA: Insufficient documentation

## 2024-04-30 DIAGNOSIS — F32A Depression, unspecified: Secondary | ICD-10-CM | POA: Diagnosis not present

## 2024-04-30 DIAGNOSIS — N99820 Postprocedural hemorrhage and hematoma of a genitourinary system organ or structure following a genitourinary system procedure: Principal | ICD-10-CM | POA: Insufficient documentation

## 2024-04-30 DIAGNOSIS — R338 Other retention of urine: Secondary | ICD-10-CM | POA: Diagnosis present

## 2024-04-30 MED ORDER — OXYCODONE-ACETAMINOPHEN 5-325 MG PO TABS
1.0000 | ORAL_TABLET | Freq: Once | ORAL | Status: AC
  Administered 2024-04-30: 1 via ORAL
  Filled 2024-04-30: qty 1

## 2024-04-30 MED ORDER — SODIUM CHLORIDE 0.9 % IR SOLN
3000.0000 mL | Status: DC
  Administered 2024-04-30 – 2024-05-01 (×3): 3000 mL

## 2024-04-30 NOTE — ED Provider Notes (Signed)
 " [facility] AT [facility] Provider Note   CSN: [id number] Arrival date & time: 04/30/24  1620     Patient presents with: No chief complaint on file.   Alexander Lambert is a 78 y.o. male.  {Add pertinent medical, surgical, social history, OB history to [id number]} HPI Patient with recent transurethral resection of prostate by Dr. Carolee.  Has catheter in place.  Actually due to be removed in 2 days.  However has had increasing pain.  Has had some blood and pain at the tip of his penis.  Still having similar urine output per patient.  However it is bloody.  Not feeling lightheaded or dizzy.  No fevers.    Prior to Admission medications  Medication Sig Start Date End Date Taking? Authorizing Provider  ALPRAZolam  (XANAX ) 0.5 MG tablet Take 1.5 mg by mouth at bedtime as needed. 11/08/20   [provider]  amLODipine (NORVASC) 5 MG tablet Take 5 mg by mouth daily. 10/26/20   [provider]  buPROPion  (WELLBUTRIN  XL) 300 MG 24 hr tablet Take 300 mg by mouth daily. 10/15/20   [provider]  Co-Enzyme Q10 200 MG CAPS Take 200 mg by mouth daily.    [provider]  ergocalciferol (VITAMIN D2) 50000 UNITS capsule Take 50,000 Units by mouth 2 (two) times a week.     [provider]  escitalopram  (LEXAPRO ) 20 MG tablet Take 30 mg by mouth daily. 11/09/20   [provider]  ferrous sulfate  325 (65 FE) MG tablet TAKE ONE TABLET TWICE DAILY 10/17/15   Abran Norleen SAILOR, MD  REPATHA SURECLICK 140 MG/ML SOAJ Taking in once monthly 10/13/20   [provider]  tamsulosin  (FLOMAX ) 0.4 MG CAPS capsule Take 0.4 mg by mouth 2 (two) times daily.    [provider]  valsartan (DIOVAN) 160 MG tablet SMARTSIG:1 Tablet(s) By Mouth Every Evening 10/12/20   [provider]  vitamin B-12 (CYANOCOBALAMIN ) 1000 MCG tablet Take 1,000 mcg by mouth daily.      [provider]  Zoledronic  Acid (RECLAST  IV) Inject into the  vein. yearly    [provider]    Allergies: Phenergan  [promethazine  hcl]    Review of Systems  Updated Vital Signs BP (!) 152/88 (BP Location: Left Arm)   Pulse 88   Temp 98 F (36.7 C) (Oral)   Resp 18   Ht 5' 11 (1.803 m)   Wt 90.7 kg   SpO2 100%   BMI 27.89 kg/m   Physical Exam Vitals and nursing note reviewed.  Abdominal:     Tenderness: There is abdominal tenderness.     Comments: Suprapubic tenderness.  Bedside ultrasound done and did show urinary retention although fluid potentially somewhat complex.    Genitourinary:    Comments: Catheter in place.  Taped to leg.  Some blood at tip of penis. Skin:    Capillary Refill: Capillary refill takes less than 2 seconds.  Neurological:     Mental Status: He is oriented to person, place, and time.     (all labs ordered are listed, but only abnormal results are displayed) Labs Reviewed - No data to display  EKG: None  Radiology: No results found.  {Document cardiac monitor, telemetry assessment procedure when appropriate:32947} Procedures   Medications Ordered in the ED  sodium chloride  irrigation 0.9 % 3,000 mL (has no administration in time range)  oxyCODONE -acetaminophen  (PERCOCET/ROXICET) 5-325 MG per tablet 1 tablet (1 tablet Oral Given 04/30/24  2126)      {Click here for ABCD2, HEART and other calculators REFRESH Note before signing:1}                              Medical Decision Making Risk Prescription drug management.   Patient with Foley catheter in place.  Pain at the tip of his penis but also some hematuria both in the urine and around the catheter.  Bedside ultrasound done and did show fluid in bladder despite the catheter.  However did look potentially complex it could be clot.  Discussed with Dr. Norva from urology.  Discussed irrigation versus replacement of catheter.  Does  {Document critical care time when appropriate  Document review of labs and clinical decision tools ie  CHADS2VASC2, etc  Document your independent review of radiology images and any outside records  Document your discussion with family members, caretakers and with consultants  Document social determinants of health affecting pt's care  Document your decision making why or why not admission, treatments were needed:32947:::1}   Final diagnoses:  None    ED Discharge Orders     None        "

## 2024-04-30 NOTE — ED Triage Notes (Signed)
 Pt presents with leaking around his foley. He has a TURP on Wednesday, urine in bag is bloody urine. Since yesterday he has had continuous leaking.

## 2024-04-30 NOTE — Telephone Encounter (Signed)
 Returned pt's call. He recently underwetn TURP with Dr. Carolee and has a foley catheter in place. He reports the catheter has come loose from where it was connected to his leg and he is not sure if it it working well. There is some urine in the tubing and bag but not as mch as there was. He  does sometimes feel that his bladder is getting full, moreso when he lies down. Urine in tubing in dark pink (has been lightening since his procedure).  Discussed that where the catheter connects to his leg is less important. However, it sounds like the catheter may not be funcitoning well given decreased output. I recommend that if patient feels full and like catheter is not draining continously, patient present to ED for bladder scan and replacement. He understands and is appreciative of call.    Maurilio Agar, MD  Urology PGY-4

## 2024-04-30 NOTE — ED Provider Triage Note (Signed)
 Emergency Medicine Provider Triage Evaluation Note  Alexander Lambert , a 78 y.o. male  was evaluated in triage.  Pt complains of hematuria with leakage around catheter.  Status post TURP.  Review of Systems  Positive:  Negative:   Physical Exam  BP (!) 158/79 (BP Location: Left Arm)   Pulse 89   Temp 97.6 F (36.4 C)   Resp 17   Ht 5' 11 (1.803 m)   Wt 90.7 kg   SpO2 100%   BMI 27.89 kg/m  Gen:   Awake, no distress   Resp:  Normal effort  MSK:   Moves extremities without difficulty  Other:    Medical Decision Making  Medically screening exam initiated at 5:28 PM.  Appropriate orders placed.  Alexander Lambert was informed that the remainder of the evaluation will be completed by another provider, this initial triage assessment does not replace that evaluation, and the importance of remaining in the ED until their evaluation is complete.     Donnajean Lynwood DEL, PA-C 04/30/24 1728

## 2024-05-01 ENCOUNTER — Encounter (HOSPITAL_COMMUNITY): Payer: Self-pay | Admitting: Family Medicine

## 2024-05-01 DIAGNOSIS — F419 Anxiety disorder, unspecified: Secondary | ICD-10-CM | POA: Diagnosis not present

## 2024-05-01 DIAGNOSIS — R338 Other retention of urine: Secondary | ICD-10-CM | POA: Diagnosis not present

## 2024-05-01 DIAGNOSIS — F32A Depression, unspecified: Secondary | ICD-10-CM | POA: Diagnosis not present

## 2024-05-01 DIAGNOSIS — I10 Essential (primary) hypertension: Secondary | ICD-10-CM | POA: Diagnosis not present

## 2024-05-01 LAB — CBC WITH DIFFERENTIAL/PLATELET
Abs Immature Granulocytes: 0.05 K/uL (ref 0.00–0.07)
Basophils Absolute: 0.1 K/uL (ref 0.0–0.1)
Basophils Relative: 1 %
Eosinophils Absolute: 0.5 K/uL (ref 0.0–0.5)
Eosinophils Relative: 6 %
HCT: 40.1 % (ref 39.0–52.0)
Hemoglobin: 13.4 g/dL (ref 13.0–17.0)
Immature Granulocytes: 1 %
Lymphocytes Relative: 27 %
Lymphs Abs: 2.2 K/uL (ref 0.7–4.0)
MCH: 31 pg (ref 26.0–34.0)
MCHC: 33.4 g/dL (ref 30.0–36.0)
MCV: 92.8 fL (ref 80.0–100.0)
Monocytes Absolute: 1 K/uL (ref 0.1–1.0)
Monocytes Relative: 12 %
Neutro Abs: 4.5 K/uL (ref 1.7–7.7)
Neutrophils Relative %: 53 %
Platelets: 202 K/uL (ref 150–400)
RBC: 4.32 MIL/uL (ref 4.22–5.81)
RDW: 13.5 % (ref 11.5–15.5)
WBC: 8.3 K/uL (ref 4.0–10.5)
nRBC: 0 % (ref 0.0–0.2)

## 2024-05-01 LAB — CBC
HCT: 40.5 % (ref 39.0–52.0)
Hemoglobin: 13.4 g/dL (ref 13.0–17.0)
MCH: 30.3 pg (ref 26.0–34.0)
MCHC: 33.1 g/dL (ref 30.0–36.0)
MCV: 91.6 fL (ref 80.0–100.0)
Platelets: 200 K/uL (ref 150–400)
RBC: 4.42 MIL/uL (ref 4.22–5.81)
RDW: 13.5 % (ref 11.5–15.5)
WBC: 8.2 K/uL (ref 4.0–10.5)
nRBC: 0 % (ref 0.0–0.2)

## 2024-05-01 LAB — BASIC METABOLIC PANEL WITH GFR
Anion gap: 9 (ref 5–15)
Anion gap: 9 (ref 5–15)
BUN: 12 mg/dL (ref 8–23)
BUN: 11 mg/dL (ref 8–23)
CO2: 24 mmol/L (ref 22–32)
CO2: 26 mmol/L (ref 22–32)
Calcium: 8.9 mg/dL (ref 8.9–10.3)
Calcium: 9.1 mg/dL (ref 8.9–10.3)
Chloride: 107 mmol/L (ref 98–111)
Chloride: 106 mmol/L (ref 98–111)
Creatinine, Ser: 0.88 mg/dL (ref 0.61–1.24)
Creatinine, Ser: 0.88 mg/dL (ref 0.61–1.24)
GFR, Estimated: >60 mL/min
GFR, Estimated: >60 mL/min
Glucose, Bld: 115 mg/dL — ABNORMAL HIGH (ref 70–99)
Glucose, Bld: 103 mg/dL — ABNORMAL HIGH (ref 70–99)
Potassium: 3.7 mmol/L (ref 3.5–5.1)
Potassium: 3.8 mmol/L (ref 3.5–5.1)
Sodium: 140 mmol/L (ref 135–145)
Sodium: 141 mmol/L (ref 135–145)

## 2024-05-01 MED ORDER — ALPRAZOLAM 0.5 MG PO TABS
1.5000 mg | ORAL_TABLET | Freq: Every evening | ORAL | Status: DC | PRN
  Administered 2024-05-01: 1.5 mg via ORAL
  Filled 2024-05-01: qty 3

## 2024-05-01 MED ORDER — TAMSULOSIN HCL 0.4 MG PO CAPS
0.4000 mg | ORAL_CAPSULE | Freq: Two times a day (BID) | ORAL | Status: DC
  Filled 2024-05-01: qty 1

## 2024-05-01 MED ORDER — OXYCODONE HCL 5 MG PO TABS: 5.0000 mg | ORAL_TABLET | ORAL | Status: DC | PRN

## 2024-05-01 MED ORDER — SODIUM CHLORIDE 0.9% FLUSH: 3.0000 mL | Freq: Two times a day (BID) | INTRAVENOUS | Status: DC

## 2024-05-01 MED ORDER — SODIUM CHLORIDE 0.9 % IV SOLN: INTRAVENOUS | Status: AC

## 2024-05-01 MED ORDER — FENTANYL CITRATE (PF) 50 MCG/ML IJ SOSY: 12.5000 ug | PREFILLED_SYRINGE | INTRAMUSCULAR | Status: DC | PRN

## 2024-05-01 MED ORDER — HYOSCYAMINE SULFATE 0.125 MG PO TABS: 0.2500 mg | ORAL_TABLET | Freq: Once | ORAL | Status: DC

## 2024-05-01 MED ORDER — HYDROMORPHONE HCL 1 MG/ML IJ SOLN: 1.0000 mg | Freq: Once | INTRAMUSCULAR | Status: DC

## 2024-05-01 MED ORDER — SENNOSIDES-DOCUSATE SODIUM 8.6-50 MG PO TABS: 1.0000 | ORAL_TABLET | Freq: Every evening | ORAL | Status: DC | PRN

## 2024-05-01 MED ORDER — IRBESARTAN 150 MG PO TABS
150.0000 mg | ORAL_TABLET | Freq: Every day | ORAL | Status: DC
  Administered 2024-05-01: 150 mg via ORAL
  Filled 2024-05-01: qty 1

## 2024-05-01 MED ORDER — ACETAMINOPHEN 650 MG RE SUPP: 650.0000 mg | Freq: Four times a day (QID) | RECTAL | Status: DC | PRN

## 2024-05-01 MED ORDER — ASPIRIN 81 MG PO TBEC: 81.0000 mg | DELAYED_RELEASE_TABLET | Freq: Every day | ORAL | Status: AC

## 2024-05-01 MED ORDER — HYOSCYAMINE SULFATE 0.125 MG SL SUBL
0.2500 mg | SUBLINGUAL_TABLET | Freq: Once | SUBLINGUAL | Status: AC
  Administered 2024-05-01: 0.25 mg via ORAL
  Filled 2024-05-01: qty 2

## 2024-05-01 MED ORDER — BUPROPION HCL ER (XL) 300 MG PO TB24
300.0000 mg | ORAL_TABLET | Freq: Every day | ORAL | Status: DC
  Administered 2024-05-01: 300 mg via ORAL
  Filled 2024-05-01: qty 1

## 2024-05-01 MED ORDER — ACETAMINOPHEN 325 MG PO TABS: 650.0000 mg | ORAL_TABLET | Freq: Four times a day (QID) | ORAL | Status: DC | PRN

## 2024-05-01 MED ORDER — LIDOCAINE HCL URETHRAL/MUCOSAL 2 % EX GEL
1.0000 | Freq: Once | CUTANEOUS | Status: AC
  Administered 2024-05-01: 1 via URETHRAL
  Filled 2024-05-01: qty 11

## 2024-05-01 MED ORDER — ESCITALOPRAM OXALATE 20 MG PO TABS
30.0000 mg | ORAL_TABLET | Freq: Every day | ORAL | Status: DC
  Administered 2024-05-01: 30 mg via ORAL
  Filled 2024-05-01: qty 1

## 2024-05-01 MED ORDER — ONDANSETRON HCL 4 MG/2ML IJ SOLN: 4.0000 mg | Freq: Four times a day (QID) | INTRAMUSCULAR | Status: DC | PRN
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
## 2024-05-01 NOTE — Discharge Summary (Signed)
 " [facility]  Physician Discharge Summary   Patient ID: Alexander Lambert MRN: [id number] DOB/AGE: 1945-09-23 78 y.o.  Admit date: 04/30/2024 Discharge date: 05/01/2024    PCP: Shayne Anes, MD  DISCHARGE DIAGNOSES:  Hematuria Recent TURP   Essential hypertension   Depression   Anxiety   RECOMMENDATIONS FOR OUTPATIENT FOLLOW UP: Follow-up with urology   Home Health: None Equipment/Devices: None  CODE STATUS: Full code  DISCHARGE CONDITION: fair  Diet recommendation: As before  INITIAL HISTORY: Alexander Lambert is a 78 y.o. male with medical history significant for hypertension, hyperlipidemia, trigeminal neuralgia, essential tremor, depression, anxiety, and BPH who underwent TURP on 04/27/2024 and now presents with bloody urine leaking around his Foley and worsening lower abdominal pain.   Patient had been doing well at home after his procedure initially but noted some bloody urine leaking around the Foley on 04/29/2024.  He reached the on-call urologist in the early a.m. of 04/30/2024 and was advised to seek evaluation in the ED if needed.  Catheter has not been draining well and he was experiencing worsening pain in the lower abdomen that was becoming severe.  He denies any associated fevers or chills.   ED Course: Upon arrival to the ED, patient is found to be afebrile and saturating well on room air with normal RR, normal HR, and stable BP.  BMP and CBC are essentially normal.   Urology (Dr. Norva) was consulted by the ED physician, urine was sent for culture, CBI was started, and the patient was treated with high Cosamin and Percocet.  HOSPITAL COURSE:   Patient was hospitalized for further management.  Underwent CBI.  Seen by urology.  Hematuria cleared up.  CBI was discontinued.  Hemoglobin remained stable.  Cleared by urology for discharge.  Close outpatient follow-up for voiding trial.    PERTINENT LABS:  The results of significant diagnostics from this  hospitalization (including imaging, microbiology, ancillary and laboratory) are listed below for reference.    Microbiology: Recent Results (from the past 240 hours)  Urine Culture     Status: None   Collection Time: 05/01/24 12:43 AM   Specimen: Urine, Catheterized  Result Value Ref Range Status   Specimen Description   Final    URINE, CATHETERIZED Performed at [facility], [address] [place], [place] [address]    Special Requests   Final    NONE Performed at [facility], [address] [place], [place] [address]    Culture   Final    NO GROWTH Performed at [facility], [address], [place], [place] [address]    Report Status 05/02/2024 FINAL  Final     Labs:   Basic Metabolic Panel: Recent Labs  Lab 05/01/24 0043 05/01/24 0437  NA 140 141  K 3.7 3.8  CL 107 106  CO2 24 26  GLUCOSE 115* 103*  BUN 12 11  CREATININE 0.88 0.88  CALCIUM 8.9 9.1    CBC: Recent Labs  Lab 05/01/24 0043 05/01/24 0437  WBC 8.3 8.2  NEUTROABS 4.5  --   HGB 13.4 13.4  HCT 40.1 40.5  MCV 92.8 91.6  PLT 202 200     IMAGING STUDIES No results found.  DISCHARGE EXAMINATION: Vitals:   05/01/24 0034 05/01/24 0341 05/01/24 0735 05/01/24 1217  BP: (!) 150/88 (!) 154/92 (!) 145/77 (!) 146/76  Pulse: 66 75 79 64  Resp: 17 16 18 18   Temp: 97.6 F (36.4 C) 98.3  F (36.8 C) 97.9 F (36.6 C) 98.1 F (36.7 C)  TempSrc: Oral Oral Oral Oral  SpO2: 94% 100% 98% 99%  Weight:      Height:       General appearance: Awake alert.  In no distress Resp: Clear to auscultation bilaterally.  Normal effort Cardio: S1-S2 is normal regular.  No S3-S4.  No rubs murmurs or bruit GI: Abdomen is soft.  Nontender nondistended.  Bowel sounds are present normal.  No masses organomegaly   DISPOSITION: Home  Discharge Instructions     Call MD for:  difficulty breathing, headache or visual disturbances   Complete by: As directed    Call MD  for:  extreme fatigue   Complete by: As directed    Call MD for:  persistant dizziness or light-headedness   Complete by: As directed    Call MD for:  persistant nausea and vomiting   Complete by: As directed    Call MD for:  severe uncontrolled pain   Complete by: As directed    Call MD for:  temperature >100.4   Complete by: As directed    Discharge instructions   Complete by: As directed    Please follow instructions provided by your urologist.  You were cared for by a hospitalist during your hospital stay. If you have any questions about your discharge medications or the care you received while you were in the hospital after you are discharged, you can call the unit and asked to speak with the hospitalist on call if the hospitalist that took care of you is not available. Once you are discharged, your primary care physician will handle any further medical issues. Please note that NO REFILLS for any discharge medications will be authorized once you are discharged, as it is imperative that you return to your primary care physician (or establish a relationship with a primary care physician if you do not have one) for your aftercare needs so that they can reassess your need for medications and monitor your lab values. If you do not have a primary care physician, you can call [phone] for a physician referral.   Increase activity slowly   Complete by: As directed          Allergies as of 05/01/2024       Reactions   Phenergan  [promethazine  Hcl] Other (See Comments)   makes him crazy        Medication List     TAKE these medications    ALPRAZolam  0.5 MG tablet Commonly known as: XANAX  Take 1.5 mg by mouth at bedtime as needed for anxiety.   amLODipine 5 MG tablet Commonly known as: NORVASC Take 5 mg by mouth daily.   aspirin  EC 81 MG tablet Take 1 tablet (81 mg total) by mouth daily. Resume after 5 days Start taking on: May 06, 2024 What changed:  additional  instructions These instructions start on May 06, 2024. If you are unsure what to do until then, ask your doctor or other care provider.   buPROPion  300 MG 24 hr tablet Commonly known as: WELLBUTRIN  XL Take 300 mg by mouth daily.   Co-Enzyme Q10 200 MG Caps Take 200 mg by mouth daily.   cyanocobalamin  1000 MCG tablet Commonly known as: VITAMIN B12 Take 1,000 mcg by mouth daily.   ergocalciferol 1.25 MG (50000 UT) capsule Commonly known as: VITAMIN D2 Take 50,000 Units by mouth 2 (two) times a week.   escitalopram  20 MG tablet Commonly known as:  LEXAPRO  Take 20 mg by mouth daily.   ferrous sulfate  325 (65 FE) MG tablet TAKE ONE TABLET TWICE DAILY What changed: when to take this   RECLAST  IV Inject into the vein. yearly   Repatha SureClick 140 MG/ML Soaj Generic drug: Evolocumab Take 140 mg by mouth See admin instructions. Twice a month   tamsulosin  0.4 MG Caps capsule Commonly known as: FLOMAX  Take 0.4 mg by mouth 2 (two) times daily.   valsartan 160 MG tablet Commonly known as: DIOVAN Take 160 mg by mouth daily.          Follow-up Information     Carolee Sherwood JONETTA DOUGLAS, MD Follow up on 05/02/2024.   Specialty: Urology Contact information: [address] [place] [address] [phone]                 TOTAL DISCHARGE TIME: 35 minutes  Alexander Lambert  [facility] Pager on [email or web address]  05/02/2024, 11:16 AM    "

## 2024-05-01 NOTE — TOC Transition Note (Signed)
 Transition of Care [facility]) - Discharge Note   Patient Details  Name: FINLEY CHEVEZ MRN: [id number] Date of Birth: April 08, 1946  Transition of [facility]) CM/SW Contact:  Sonda Manuella Quill, RN Phone Number: 05/01/2024, 4:10 PM   Clinical Narrative:    D/C orders received; no IP CM needs.   Final next level of care: Home/Self Care Barriers to Discharge: No Barriers Identified   Patient Goals and CMS Choice Patient states their goals for this hospitalization and ongoing recovery are:: home     [place] ownership interest in [facility].provided to:: Spouse    Discharge Placement                       Discharge Plan and Services Additional resources added to the After Visit Summary for     Discharge Planning Services: CM Consult            DME Arranged: N/A DME Agency: NA       HH Arranged: NA HH Agency: NA        Social Drivers of Health (SDOH) Interventions SDOH Screenings   Food Insecurity: No Food Insecurity (05/01/2024)  Housing: Low Risk (05/01/2024)  Transportation Needs: No Transportation Needs (05/01/2024)  Utilities: Not At Risk (05/01/2024)  Social Connections: Socially Integrated (05/01/2024)  Tobacco Use: Medium Risk (04/30/2024)     Readmission Risk Interventions     No data to display

## 2024-05-01 NOTE — Care Management Obs Status (Signed)
 MEDICARE OBSERVATION STATUS NOTIFICATION   Patient Details  Name: Alexander Lambert MRN: [id number] Date of Birth: 1946/01/11   Medicare Observation Status Notification Given:  Yes    Sonda Manuella Quill, RN 05/01/2024, 9:40 AM

## 2024-05-01 NOTE — Plan of Care (Signed)

## 2024-05-01 NOTE — TOC Initial Note (Signed)
 Transition of Care [facility]) - Initial/Assessment Note    Patient Details  Name: Alexander Lambert MRN: [id number] Date of Birth: Jul 07, 1945  Transition of Care [facility]) CM/SW Contact:    Sonda Manuella Quill, RN Phone Number: 05/01/2024, 9:45 AM  Clinical Narrative:                 Spoke w/ pt and spouse Inocente Core [phone]) in room; pt lives at home; he plans to return w/ her support at d/c; she will provide transportation; insurance/PCP verified; pt denied experiencing SDOH risks; he does not have DME, HH services, or home oxygen; IP CM will follow.  Expected Discharge Plan: Home/Self Care Barriers to Discharge: Continued Medical Work up   Patient Goals and CMS Choice Patient states their goals for this hospitalization and ongoing recovery are:: home     [place] ownership interest in [facility].provided to:: Spouse    Expected Discharge Plan and Services   Discharge Planning Services: CM Consult   Living arrangements for the past 2 months: Single Family Home                 DME Arranged: N/A DME Agency: NA       HH Arranged: NA HH Agency: NA        Prior Living Arrangements/Services Living arrangements for the past 2 months: Single Family Home Lives with:: Spouse Patient language and need for interpreter reviewed:: Yes Do you feel safe going back to the place where you live?: Yes      Need for Family Participation in Patient Care: Yes (Comment) Care giver support system in place?: Yes (comment) Current home services:  (n/a) Criminal Activity/Legal Involvement Pertinent to Current Situation/Hospitalization: No - Comment as needed  Activities of Daily Living   ADL Screening (condition at time of admission) Independently performs ADLs?: No Does the patient have a NEW difficulty with bathing/dressing/toileting/self-feeding that is expected to last >3 days?: No Does the patient have a NEW difficulty with getting in/out of bed, walking, or climbing stairs  that is expected to last >3 days?: No Does the patient have a NEW difficulty with communication that is expected to last >3 days?: No Is the patient deaf or have difficulty hearing?: Yes Does the patient have difficulty seeing, even when wearing glasses/contacts?: No Does the patient have difficulty concentrating, remembering, or making decisions?: No  Permission Sought/Granted Permission sought to share information with : Case Manager Permission granted to share information with : Yes, Verbal Permission Granted  Share Information with NAME: Case Manager     Permission granted to share info w Relationship: Kainen Struckman (spouse) [phone]     Emotional Assessment Appearance:: Appears stated age Attitude/Demeanor/Rapport: Gracious Affect (typically observed): Accepting Orientation: : Oriented to Self, Oriented to Place, Oriented to  Time, Oriented to Situation Alcohol / Substance Use: Not Applicable Psych Involvement: No (comment)  Admission diagnosis:  Clot retention of urine [R33.8] Hematuria, unspecified type [R31.9] Patient Active Problem List   Diagnosis Date Noted   Clot retention of urine 05/01/2024   Essential hypertension 05/01/2024   Depression    Anxiety    S/P Nissen fundoplication (without gastrostomy tube) procedure 07/22/2017   Dysphagia 05/20/2017   Vomiting in adult 02/13/2017   EtOH dependence (HCC) 02/13/2017   Acute encephalopathy 02/13/2017   Hematemesis 02/13/2017   Enteritis 02/13/2017   Acute delirium    OTH NONSPECIFIC ABNORM CV SYSTEM FUNCTION STUDY 02/09/2009   HYPERCHOLESTEROLEMIA 01/24/2009   FATIGUE 01/24/2009   PRECORDIAL  PAIN 01/24/2009   PCP:  Shayne Anes, MD Pharmacy:   [facility] - [place], [place] - [address] [place] [address] Phone: [phone] Fax: [phone]  [facility] [address] [place] [address] Phone: [phone] Fax:  [phone]  [id number] [place], [place] - [address] AT [facility] [address] [place] [address] Phone: [phone] Fax: [phone]     Social Drivers of Health (SDOH) Social History: SDOH Screenings   Food Insecurity: No Food Insecurity (05/01/2024)  Housing: Low Risk (05/01/2024)  Transportation Needs: No Transportation Needs (05/01/2024)  Utilities: Not At Risk (05/01/2024)  Social Connections: Socially Integrated (05/01/2024)  Tobacco Use: Medium Risk (04/30/2024)   SDOH Interventions: Food Insecurity Interventions: Intervention Not Indicated, Inpatient TOC Housing Interventions: Intervention Not Indicated, Inpatient TOC Transportation Interventions: Intervention Not Indicated, Inpatient TOC Utilities Interventions: Intervention Not Indicated, Inpatient TOC   Readmission Risk Interventions     No data to display
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
## 2024-05-01 NOTE — ED Provider Notes (Signed)
 LILLETTE Fairy Gravely, assumed care for this patient.  In brief 78 year old male here today with hematuria, passing clots.  Patient was placed on CBI by prior team.  Unfortunately, patient continues to pass large clots, is having discomfort.  Given persistent clots, will admit to hospitalist service.  Secure chat message sent to urology team that been previously consulted.   Gravely Fairy T, DO 05/01/24 [phone]

## 2024-05-01 NOTE — Progress Notes (Signed)
 Patient admitted earlier this morning.  H&P reviewed.  Patient seen and examined.  Patient denies any abdominal pain nausea vomiting.  Denies any dizziness or lightheadedness.  Vital signs reviewed. Lungs are clear to auscultation. S1-S2 is normal regular. Abdomen is soft.  Nontender nondistended. Foley catheter is noted with clear yellow urine.  Hemoglobin is 13.4.  Creatinine is 0.88.  Patient with recent TURP procedure on 12/17 who presented with gross hematuria.  Urology is following and managing.  Seems to be stable from a medical standpoint.  Possible discharge later today if cleared by urology and if his hematuria does not recur after discontinuation of CBI.  Joette Pebbles 05/01/2024

## 2024-05-01 NOTE — Final Progress Note (Signed)
 IV removed. AVS reviewed. Pt verbalizes understanding and denies further needs.

## 2024-05-01 NOTE — Plan of Care (Signed)
  Problem: Education: Goal: Knowledge of General Education information will improve Description: Including pain rating scale, medication(s)/side effects and non-pharmacologic comfort measures Outcome: Progressing   Problem: Activity: Goal: Risk for activity intolerance will decrease Outcome: Progressing   Problem: Elimination: Goal: Will not experience complications related to urinary retention Outcome: Progressing   Problem: Pain Managment: Goal: General experience of comfort will improve and/or be controlled Outcome: Progressing   Problem: Safety: Goal: Ability to remain free from injury will improve Outcome: Progressing

## 2024-05-02 LAB — URINE CULTURE: Culture: NO GROWTH
